# Patient Record
Sex: Female | Born: 1980 | Hispanic: Yes | Smoking: Never smoker
Health system: Southern US, Community
[De-identification: ages and names within clinical notes are randomized; demographics above are authoritative.]

## PROBLEM LIST (undated history)

## (undated) DIAGNOSIS — K8020 Calculus of gallbladder without cholecystitis without obstruction: Secondary | ICD-10-CM

## (undated) HISTORY — PX: NO PAST SURGERIES: SHX2092

---

## 2009-10-16 ENCOUNTER — Ambulatory Visit (HOSPITAL_COMMUNITY): Admission: RE | Admit: 2009-10-16 | Discharge: 2009-10-16 | Payer: Self-pay | Admitting: Obstetrics & Gynecology

## 2010-01-21 ENCOUNTER — Ambulatory Visit: Payer: Self-pay | Admitting: Obstetrics & Gynecology

## 2010-01-26 ENCOUNTER — Inpatient Hospital Stay (HOSPITAL_COMMUNITY): Admission: AD | Admit: 2010-01-26 | Discharge: 2010-01-27 | Payer: Self-pay | Admitting: Family Medicine

## 2010-01-26 ENCOUNTER — Ambulatory Visit: Payer: Self-pay | Admitting: Physician Assistant

## 2011-03-03 LAB — CBC
Hemoglobin: 11.9 g/dL — ABNORMAL LOW (ref 12.0–15.0)
MCHC: 33 g/dL (ref 30.0–36.0)
MCV: 87.3 fL (ref 78.0–100.0)
RBC: 4.15 MIL/uL (ref 3.87–5.11)

## 2014-04-22 ENCOUNTER — Encounter (HOSPITAL_COMMUNITY): Payer: Self-pay | Admitting: Emergency Medicine

## 2014-04-22 ENCOUNTER — Emergency Department (INDEPENDENT_AMBULATORY_CARE_PROVIDER_SITE_OTHER)
Admission: EM | Admit: 2014-04-22 | Discharge: 2014-04-22 | Disposition: A | Discharge status: Home / Self Care | Payer: Self-pay | Source: Home / Self Care | Attending: Family Medicine | Admitting: Family Medicine

## 2014-04-22 ENCOUNTER — Emergency Department (HOSPITAL_COMMUNITY)
Admission: EM | Admit: 2014-04-22 | Discharge: 2014-04-23 | Disposition: A | Discharge status: Home / Self Care | Payer: No Typology Code available for payment source | Attending: Emergency Medicine | Admitting: Emergency Medicine

## 2014-04-22 ENCOUNTER — Emergency Department (HOSPITAL_COMMUNITY): Payer: No Typology Code available for payment source

## 2014-04-22 DIAGNOSIS — K8020 Calculus of gallbladder without cholecystitis without obstruction: Secondary | ICD-10-CM | POA: Insufficient documentation

## 2014-04-22 DIAGNOSIS — K8050 Calculus of bile duct without cholangitis or cholecystitis without obstruction: Secondary | ICD-10-CM

## 2014-04-22 DIAGNOSIS — R110 Nausea: Secondary | ICD-10-CM | POA: Insufficient documentation

## 2014-04-22 DIAGNOSIS — R198 Other specified symptoms and signs involving the digestive system and abdomen: Secondary | ICD-10-CM

## 2014-04-22 DIAGNOSIS — R1011 Right upper quadrant pain: Secondary | ICD-10-CM

## 2014-04-22 LAB — COMPREHENSIVE METABOLIC PANEL
ALK PHOS: 97 U/L (ref 39–117)
ALT: 213 U/L — AB (ref 0–35)
AST: 172 U/L — ABNORMAL HIGH (ref 0–37)
Albumin: 3.9 g/dL (ref 3.5–5.2)
BILIRUBIN TOTAL: 2.9 mg/dL — AB (ref 0.3–1.2)
BUN: 7 mg/dL (ref 6–23)
CHLORIDE: 101 meq/L (ref 96–112)
CO2: 24 mEq/L (ref 19–32)
Calcium: 9.4 mg/dL (ref 8.4–10.5)
Creatinine, Ser: 0.54 mg/dL (ref 0.50–1.10)
GFR calc non Af Amer: >90 mL/min (ref >90)
GLUCOSE: 92 mg/dL (ref 70–99)
POTASSIUM: 3.8 meq/L (ref 3.7–5.3)
SODIUM: 138 meq/L (ref 137–147)
TOTAL PROTEIN: 7.9 g/dL (ref 6.0–8.3)

## 2014-04-22 LAB — CBC WITH DIFFERENTIAL/PLATELET
Basophils Absolute: 0 10*3/uL (ref 0.0–0.1)
Basophils Relative: 0 % (ref 0–1)
EOS ABS: 0 10*3/uL (ref 0.0–0.7)
Eosinophils Relative: 0 % (ref 0–5)
HCT: 38.5 % (ref 36.0–46.0)
HEMOGLOBIN: 13.2 g/dL (ref 12.0–15.0)
LYMPHS ABS: 1.3 10*3/uL (ref 0.7–4.0)
LYMPHS PCT: 16 % (ref 12–46)
MCH: 29.6 pg (ref 26.0–34.0)
MCHC: 34.3 g/dL (ref 30.0–36.0)
MCV: 86.3 fL (ref 78.0–100.0)
MONOS PCT: 7 % (ref 3–12)
Monocytes Absolute: 0.5 10*3/uL (ref 0.1–1.0)
NEUTROS ABS: 6.3 10*3/uL (ref 1.7–7.7)
NEUTROS PCT: 77 % (ref 43–77)
PLATELETS: 227 10*3/uL (ref 150–400)
RBC: 4.46 MIL/uL (ref 3.87–5.11)
RDW: 12.7 % (ref 11.5–15.5)
WBC: 8.2 10*3/uL (ref 4.0–10.5)

## 2014-04-22 LAB — POCT URINALYSIS DIP (DEVICE)
GLUCOSE, UA: NEGATIVE mg/dL
Hgb urine dipstick: NEGATIVE
KETONES UR: NEGATIVE mg/dL
NITRITE: NEGATIVE
PH: 8.5 — AB (ref 5.0–8.0)
Protein, ur: NEGATIVE mg/dL
Specific Gravity, Urine: 1.02 (ref 1.005–1.030)
Urobilinogen, UA: 2 mg/dL — ABNORMAL HIGH (ref 0.0–1.0)

## 2014-04-22 LAB — POCT PREGNANCY, URINE: PREG TEST UR: NEGATIVE

## 2014-04-22 LAB — LIPASE, BLOOD: LIPASE: 20 U/L (ref 11–59)

## 2014-04-22 MED ORDER — ONDANSETRON 4 MG PO TBDP
4.0000 mg | ORAL_TABLET | Freq: Once | ORAL | Status: AC
  Administered 2014-04-22: 4 mg via ORAL
  Filled 2014-04-22: qty 1

## 2014-04-22 NOTE — ED Provider Notes (Signed)
CSN: [id number]     Arrival date & time 04/22/14  1745 History   First MD Initiated Contact with Patient 04/22/14 1850     Chief Complaint  Patient presents with  . Abdominal Pain   (Consider location/radiation/quality/duration/timing/severity/associated sxs/prior Treatment) HPI Comments: 33 year old female presents complaining of 2 days of worsening right upper quadrant abdominal pain and nausea. Her pain has been getting progressively worse. It waxes and wanes in severity but never completely goes away. It has been made worse by eating anything but not by drinking water. She has associated nausea. Without vomiting. She has no diarrhea. She has no respiratory symptoms. No recent travel or sick contacts. She had similar symptoms 6 months ago but they were not near this bad. She rates her pain as 10 out of 10 earlier, but currently 7/10. No history of any abdominal surgeries.   History reviewed. No pertinent past medical history. History reviewed. No pertinent past surgical history. No family history on file. History  Substance Use Topics  . Smoking status: Never Smoker   . Smokeless tobacco: Not on file  . Alcohol Use: No   OB History   Grav Para Term Preterm Abortions TAB SAB Ect Mult Living                 Review of Systems  Constitutional: Negative for fever and chills.  Gastrointestinal: Positive for nausea and abdominal pain. Negative for vomiting, diarrhea and constipation.  All other systems reviewed and are negative.   Allergies  Review of patient's allergies indicates no known allergies.  Home Medications   Prior to Admission medications   Not on File   BP 128/81  Pulse 84  Temp(Src) 98.5 F (36.9 C) (Oral)  Resp 16  SpO2 99%  LMP 04/04/2014 Physical Exam  Nursing note and vitals reviewed. Constitutional: She is oriented to person, place, and time. Vital signs are normal. She appears well-developed and well-nourished. No distress.  HENT:  Head: Normocephalic  and atraumatic.  Pulmonary/Chest: Effort normal. No respiratory distress. She exhibits no tenderness.  Abdominal: Normal appearance and bowel sounds are normal. There is no hepatosplenomegaly. There is tenderness in the right upper quadrant. There is positive Murphy's sign. There is no rigidity, no rebound, no guarding, no CVA tenderness and no tenderness at McBurney's point.  Neurological: She is alert and oriented to person, place, and time. She has normal strength. Coordination normal.  Skin: Skin is warm and dry. No rash noted. She is not diaphoretic.  Psychiatric: She has a normal mood and affect. Judgment normal.    ED Course  Procedures (including critical care time) Labs Review Labs Reviewed  POCT URINALYSIS DIP (DEVICE) - Abnormal; Notable for the following:    Bilirubin Urine SMALL (*)    pH 8.5 (*)    Urobilinogen, UA 2.0 (*)    Leukocytes, UA TRACE (*)    All other components within normal limits  POCT PREGNANCY, URINE    Imaging Review No results found.   MDM   1. RUQ abdominal pain   2. Positive Murphy's Sign    I've discussed with this patient the option of sending labs here and based on that deciding whether to get ultrasound tomorrow or to send her to the emergency department tonight for evaluation, or just transfer to the emergency department for a thorough definitive workup of her symptoms. She elects to just go to the emergency department.    Graylon GoodZachary H Corean Yoshimura, PA-C 04/22/14 1947

## 2014-04-22 NOTE — ED Notes (Signed)
Pt returned from ultrasound

## 2014-04-22 NOTE — ED Provider Notes (Signed)
CSN: [id number]     Arrival date & time 04/22/14  1945 History   First MD Initiated Contact with Patient 04/22/14 2145     Chief Complaint  Patient presents with  . Abdominal Pain     (Consider location/radiation/quality/duration/timing/severity/associated sxs/prior Treatment) Patient is a 33 y.o. female presenting with abdominal pain. The history is provided by the patient.  Abdominal Pain Associated symptoms: nausea   Associated symptoms: no chest pain, no diarrhea, no fever, no shortness of breath and no vomiting    patient has had upper abdominal pain for last 2 days. Is worse after eating. She did seen at urgent care and sent here to rule out biliary disease. She's had somewhat decreased oral intake and appetite. No fevers. She's had some nausea without vomiting or diarrhea. No fevers. She denies possibility of pregnancy  History reviewed. No pertinent past medical history. History reviewed. No pertinent past surgical history. History reviewed. No pertinent family history. History  Substance Use Topics  . Smoking status: Never Smoker   . Smokeless tobacco: Not on file  . Alcohol Use: No   OB History   Grav Para Term Preterm Abortions TAB SAB Ect Mult Living                 Review of Systems  Constitutional: Positive for appetite change. Negative for fever and activity change.  Eyes: Negative for pain.  Respiratory: Negative for chest tightness and shortness of breath.   Cardiovascular: Negative for chest pain and leg swelling.  Gastrointestinal: Positive for nausea and abdominal pain. Negative for vomiting and diarrhea.  Genitourinary: Negative for flank pain.  Musculoskeletal: Negative for back pain and neck stiffness.  Skin: Negative for rash.  Neurological: Negative for weakness, numbness and headaches.  Psychiatric/Behavioral: Negative for behavioral problems.      Allergies  Review of patient's allergies indicates no known allergies.  Home Medications    Prior to Admission medications   Medication Sig Start Date End Date Taking? Authorizing Provider  ibuprofen (ADVIL,MOTRIN) 200 MG tablet Take 400 mg by mouth every 6 (six) hours as needed for mild pain.   Yes Historical Provider, MD   BP 105/72  Pulse 84  Temp(Src) 99.1 F (37.3 C) (Oral)  Resp 20  Ht 5\' 1"  (1.549 m)  Wt 177 lb 7 oz (80.485 kg)  BMI 33.54 kg/m2  SpO2 100%  LMP 04/04/2014 Physical Exam  Nursing note and vitals reviewed. Constitutional: She is oriented to person, place, and time. She appears well-developed and well-nourished.  HENT:  Head: Normocephalic and atraumatic.  Eyes: EOM are normal. Pupils are equal, round, and reactive to light.  Neck: Normal range of motion. Neck supple.  Cardiovascular: Normal rate, regular rhythm and normal heart sounds.   No murmur heard. Pulmonary/Chest: Effort normal and breath sounds normal. No respiratory distress. She has no wheezes. She has no rales.  Abdominal: Soft. Bowel sounds are normal. She exhibits no distension. There is tenderness. There is no rebound and no guarding.  Somewhat obese. Right upper quadrant tenderness without rebound or guarding.  Musculoskeletal: Normal range of motion.  Neurological: She is alert and oriented to person, place, and time. No cranial nerve deficit.  Skin: Skin is warm and dry.  Psychiatric: She has a normal mood and affect. Her speech is normal.    ED Course  Procedures (including critical care time) Labs Review Labs Reviewed  COMPREHENSIVE METABOLIC PANEL - Abnormal; Notable for the following:    AST 172 (*)  ALT 213 (*)    Total Bilirubin 2.9 (*)    All other components within normal limits  CBC WITH DIFFERENTIAL  LIPASE, BLOOD    Imaging Review [place] Abdomen Complete  04/22/2014   CLINICAL DATA:  Right upper quadrant pain with elevated LFTs  EXAM: ULTRASOUND ABDOMEN COMPLETE  COMPARISON:  None.  FINDINGS: Gallbladder:  Multiple mobile gallstones are identified. No  significant wall thickening is noted. A negative sonographic Eulah PontMurphy sign is seen.  Common bile duct:  Diameter: 6 mm. This is at the upper limits of normal for the patient's age.  Liver:  No mass lesion is noted.  No biliary ductal dilatation is seen.  IVC:  No abnormality visualized.  Pancreas:  Not well visualized due to overlying bowel gas.  Spleen:  Size and appearance within normal limits.  Right Kidney:  Length: 10.8 cm. Echogenicity within normal limits. No mass or hydronephrosis visualized.  Left Kidney:  Length: 10.9 cm. A 1 cm focus of increased echogenicity is noted in the upper pole of the left kidney. 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 No definitive shadowing to suggest calculus is noted. A  Abdominal aorta:  No aneurysm visualized.  Other findings:  None.  IMPRESSION: Multiple gallstones without complicating factors. The common bile duct is at the upper limits of normal for the patient's age. Given the LFTs possible obstructive process could not be totally excluded although no intrahepatic biliary ductal dilatation is noted.  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   Electronically Signed   By: Alcide CleverMark  Lukens M.D.   On: 04/22/2014 23:28     EKG Interpretation None      MDM   Final diagnoses:  Biliary colic    The patient's abdominal pain. LFTs elevated. Ultrasound shows gallstones without cholecystitis. Discussed with general surgery, who will see the patient in followup in the clinic.    Juliet RudeNathan R. Rubin PayorPickering, MD 04/23/14 0003

## 2014-04-22 NOTE — ED Notes (Signed)
Dr Pickering at bedside 

## 2014-04-22 NOTE — ED Notes (Signed)
Pt c/o RUQ pain that radiates to back onset 2 days Pain is mild and constant w/intermittent sharp pains Increases w/pressure Sx also include nausea and tingly of hands w/sharp pains Denies f/v/d; normal BM, urinary sx Taking tyle w/no relief Alert w/no signs of acute distress.

## 2014-04-22 NOTE — ED Provider Notes (Signed)
Medical screening examination/treatment/procedure(s) were performed by resident physician or non-physician practitioner and as supervising physician I was immediately available for consultation/collaboration.   Hildreth Orsak DOUGLAS MD.   Kiely Cousar D Gurpreet Mariani, MD 04/22/14 2043 

## 2014-04-22 NOTE — ED Notes (Addendum)
Pt from urgent care to R/O gallstones. Pt had urine done at urgent care with negative pregnancy. Pt states right upper quadrant pain. Pt states Sunday night the pain started. Pt states constant pain with intermittant increases after she drinks water. Pt states that pain stays local to right upper quadrant.

## 2014-04-22 NOTE — Discharge Instructions (Signed)

## 2014-06-02 ENCOUNTER — Ambulatory Visit (INDEPENDENT_AMBULATORY_CARE_PROVIDER_SITE_OTHER): Payer: No Typology Code available for payment source | Admitting: Internal Medicine

## 2014-06-02 ENCOUNTER — Encounter: Payer: Self-pay | Admitting: Internal Medicine

## 2014-06-02 VITALS — BP 120/76 | HR 79 | Temp 98.0°F | Resp 20 | Ht 60.0 in | Wt 178.0 lb

## 2014-06-02 DIAGNOSIS — K8020 Calculus of gallbladder without cholecystitis without obstruction: Secondary | ICD-10-CM

## 2014-06-02 DIAGNOSIS — Z0000 Encounter for general adult medical examination without abnormal findings: Secondary | ICD-10-CM

## 2014-06-02 DIAGNOSIS — R748 Abnormal levels of other serum enzymes: Secondary | ICD-10-CM | POA: Insufficient documentation

## 2014-06-02 NOTE — Progress Notes (Signed)
Patient ID: Christy Boyer, female   DOB: 05/07/1981, 33 y.o.   MRN: [id number]   Christy Boyer, is a 33 y.o. female  [id number]  [id number]  DOB - 05/07/1981  CC:  Chief Complaint  Patient presents with  . Establish Care    NP/Pt states at times mid to lower back pain. Pt is in no pain at this time       HPI: Christy Boyer is a 33 y.o. female here today to establish medical care. She was recently seen in the ED for back pain and diagnosed with gallstones without Cholecystitis. She states that on the day that she was seen in the ED she had N/V and back pain. She has been able to control symptoms by avoiding certain foods (fatty foods). She takes occasional ibuprofen for pain and sticks to non-fatty foods.  She denies any other problems. She had her cholesterol done about 3 years ago at which time she reported it to be normal.   Patient has No headache, No chest pain, No abdominal pain - No Nausea, No new weakness tingling or numbness, No Cough - SOB.   Information communicated through medical interpreter.  No Known Allergies History reviewed. No pertinent past medical history. Current Outpatient Prescriptions on File Prior to Visit  Medication Sig Dispense Refill  . ibuprofen (ADVIL,MOTRIN) 200 MG tablet Take 400 mg by mouth every 6 (six) hours as needed for mild pain.       No current facility-administered medications on file prior to visit.   Family History  Problem Relation Age of Onset  . Heart disease Father    History   Social History  . Marital Status: Married    Spouse Name: N/A    Number of Children: 3  . Years of Education: N/A   Occupational History  . Not on file.   Social History Main Topics  . Smoking status: Never Smoker   . Smokeless tobacco: Not on file  . Alcohol Use: Yes     Comment: Very infrequently.  . Drug Use: No  . Sexual Activity: Yes    Birth Control/ Protection: IUD     Comment:  [employer])- 4 years   Other Topics Concern  . Not on file   Social History Narrative  . No narrative on file    Review of Systems: Constitutional: Negative for fever, chills, diaphoresis, activity change, appetite change and fatigue. HENT: Negative for ear pain, nosebleeds, congestion, facial swelling, rhinorrhea, neck pain, neck stiffness and ear discharge.  Eyes: Negative for pain, discharge, redness, itching and visual disturbance. Respiratory: Negative for cough, choking, chest tightness, shortness of breath, wheezing and stridor.  Cardiovascular: Negative for chest pain, palpitations and leg swelling. Gastrointestinal: Negative for abdominal distention. Genitourinary: Negative for dysuria, urgency, frequency, hematuria, flank pain, decreased urine volume, difficulty urinating and dyspareunia.  Musculoskeletal: Negative for back pain, joint swelling, arthralgia and gait problem. Neurological: Negative for dizziness, tremors, seizures, syncope, facial asymmetry, speech difficulty, weakness, light-headedness, numbness and headaches.  Hematological: Negative for adenopathy. Does not bruise/bleed easily. Psychiatric/Behavioral: Negative for hallucinations, behavioral problems, confusion, dysphoric mood, decreased concentration and agitation.    Objective:     Filed Vitals:   06/02/14 1046  BP: 120/76  Pulse: 79  Temp: 98 F (36.7 C)  Resp: 20    Physical Exam: Constitutional: Patient appears well-developed and well-nourished. No distress. Well appearing. HENT: Normocephalic, atraumatic, External right and left ear normal. Oropharynx is clear and moist.  Eyes: Conjunctivae and  EOM are normal. PERRLA, no scleral icterus. Neck: Normal ROM. Neck supple. No JVD. No tracheal deviation. No thyromegaly. CVS: RRR, S1/S2 +, no murmurs, no gallops, no carotid bruit.  Pulmonary: Effort and breath sounds normal, no stridor, rhonchi, wheezes, rales.  Abdominal:  Soft. BS +, no distension, tenderness, rebound or guarding. Particularly no RUQ tenderness noted and Murphy's sign absent. Musculoskeletal: Normal range of motion. No edema and no tenderness.  Lymphadenopathy: No lymphadenopathy noted, cervical, inguinal or axillary Neuro: Alert. Normal reflexes, muscle tone coordination. No cranial nerve deficit. Skin: Skin is warm and dry. No rash noted. Not diaphoretic. No erythema. No pallor. Psychiatric: Normal mood and affect. Behavior, judgment, thought content normal.  Lab Results  Component Value Date   WBC 8.2 04/22/2014   HGB 13.2 04/22/2014   HCT 38.5 04/22/2014   MCV 86.3 04/22/2014   PLT 227 04/22/2014   Lab Results  Component Value Date   CREATININE 0.54 04/22/2014   BUN 7 04/22/2014   NA 138 04/22/2014   K 3.8 04/22/2014   CL 101 04/22/2014   CO2 24 04/22/2014    No results found for this basename: HGBA1C   Lipid Panel  No results found for this basename: chol, trig, hdl, cholhdl, vldl, ldlcalc       Assessment and plan:  1. Cholelithiasis: Pt has gallstones without signs of acute inflammation.She did have an episode of biliary colic and was evaluated and treated in the ED. I will refer to Surgery for possible cholecystectomy.   2. Elevated Liver Enzymes: Will recheck CMET. Likely associated with biliary Colic.  2. Preventative Care: [facility] Will obtain records, Check fasting lipids.   Return in about 2 weeks (around 06/16/2014).  Labs: Lipid panel, CMET, U/A, TSH. Vitamin D.  The patient was given clear instructions to go to ER or return to medical center if symptoms don't improve, worsen or new problems develop. The patient verbalized understanding. The patient was told to call to get lab results if they haven't heard anything in the next week.     This note has been created with Education officer, environmentalDragon speech recognition software and smart phrase technology. Any transcriptional errors are unintentional.     MATTHEWS,MICHELLE A., MD [facility] [place], [place] [id number]   06/02/2014, 11:21 AM

## 2014-06-03 ENCOUNTER — Other Ambulatory Visit: Payer: No Typology Code available for payment source

## 2014-06-03 DIAGNOSIS — K8020 Calculus of gallbladder without cholecystitis without obstruction: Secondary | ICD-10-CM

## 2014-06-03 DIAGNOSIS — Z0000 Encounter for general adult medical examination without abnormal findings: Secondary | ICD-10-CM

## 2014-06-03 DIAGNOSIS — R748 Abnormal levels of other serum enzymes: Secondary | ICD-10-CM

## 2014-06-04 LAB — COMPREHENSIVE METABOLIC PANEL
ALBUMIN: 4.3 g/dL (ref 3.5–5.2)
ALK PHOS: 66 U/L (ref 39–117)
ALT: 18 U/L (ref 0–35)
AST: 15 U/L (ref 0–37)
BILIRUBIN TOTAL: 0.7 mg/dL (ref 0.2–1.2)
BUN: 12 mg/dL (ref 6–23)
CO2: 24 meq/L (ref 19–32)
Calcium: 9.2 mg/dL (ref 8.4–10.5)
Chloride: 105 mEq/L (ref 96–112)
Creat: 0.56 mg/dL (ref 0.50–1.10)
Glucose, Bld: 96 mg/dL (ref 70–99)
POTASSIUM: 4 meq/L (ref 3.5–5.3)
SODIUM: 138 meq/L (ref 135–145)
TOTAL PROTEIN: 7.3 g/dL (ref 6.0–8.3)

## 2014-06-04 LAB — LIPID PANEL
CHOL/HDL RATIO: 2.7 ratio
Cholesterol: 118 mg/dL (ref 0–200)
HDL: 44 mg/dL (ref >39)
LDL CALC: 63 mg/dL (ref 0–99)
Triglycerides: 56 mg/dL (ref <150)
VLDL: 11 mg/dL (ref 0–40)

## 2014-06-04 LAB — VITAMIN D 25 HYDROXY (VIT D DEFICIENCY, FRACTURES): Vit D, 25-Hydroxy: 26 ng/mL — ABNORMAL LOW (ref 30–89)

## 2014-06-16 ENCOUNTER — Encounter: Payer: Self-pay | Admitting: Internal Medicine

## 2014-06-16 ENCOUNTER — Ambulatory Visit (INDEPENDENT_AMBULATORY_CARE_PROVIDER_SITE_OTHER): Payer: No Typology Code available for payment source | Admitting: Internal Medicine

## 2014-06-16 VITALS — BP 107/63 | HR 81 | Temp 98.6°F | Resp 20 | Ht 61.0 in | Wt 178.0 lb

## 2014-06-16 DIAGNOSIS — K8020 Calculus of gallbladder without cholecystitis without obstruction: Secondary | ICD-10-CM

## 2014-06-16 DIAGNOSIS — K8050 Calculus of bile duct without cholangitis or cholecystitis without obstruction: Secondary | ICD-10-CM

## 2014-06-16 DIAGNOSIS — E559 Vitamin D deficiency, unspecified: Secondary | ICD-10-CM

## 2014-06-16 DIAGNOSIS — G8929 Other chronic pain: Secondary | ICD-10-CM

## 2014-06-16 DIAGNOSIS — R1011 Right upper quadrant pain: Secondary | ICD-10-CM

## 2014-06-16 DIAGNOSIS — R748 Abnormal levels of other serum enzymes: Secondary | ICD-10-CM

## 2014-06-16 MED ORDER — VITAMIN D 50 MCG (2000 UT) PO CAPS: 4000.0000 [IU] | ORAL_CAPSULE | Freq: Every day | ORAL | Status: DC

## 2014-06-16 NOTE — Progress Notes (Signed)
   Subjective:    Patient ID: Christy Boyer, female    DOB: 12/16/1980, 33 y.o.   MRN: [id number]  HPI: Pt here for follow up of gallstones and reports that she continues to have biliary colic with certain foods. She denies pain or tenderness otherwise.  She has been initially resistant to referral to Surgery, however after a long discussion today she is accepting of the idea of an elective cholecystectomy.   She otherwise has no other complaints.   Review of Systems  Constitutional: Negative.  Negative for fever, chills, activity change, appetite change and fatigue.  HENT: Negative.  Negative for dental problem, ear pain, hearing loss, sore throat and trouble swallowing.   Eyes: Negative.  Negative for visual disturbance.  Respiratory: Negative.  Negative for cough, chest tightness, shortness of breath and wheezing.   Cardiovascular: Negative.  Negative for chest pain, palpitations and leg swelling.  Gastrointestinal: Positive for nausea (Biliary colic with certain foods.) and abdominal pain (Biliary colic with certain foods.). Negative for diarrhea, constipation, blood in stool, anal bleeding and rectal pain.       Biliary colic with certain foods.  Endocrine: Negative.  Negative for cold intolerance, heat intolerance, polydipsia, polyphagia and polyuria.  Genitourinary: Negative for dysuria, urgency, frequency, genital sores, vaginal pain, menstrual problem and dyspareunia.  Musculoskeletal: Negative.   Skin: Negative.   Allergic/Immunologic: Negative.  Negative for environmental allergies.  Neurological: Negative.  Negative for dizziness, tremors, weakness and headaches.  Hematological: Negative.   Psychiatric/Behavioral: Negative.   All other systems reviewed and are negative.      Objective:   Physical Exam  Constitutional: She is oriented to person, place, and time. She appears well-developed and well-nourished.  HENT:  Head: Normocephalic and atraumatic.  Eyes:  Conjunctivae and EOM are normal. Pupils are equal, round, and reactive to light. No scleral icterus.  Neck: Normal range of motion. Neck supple. No JVD present. No thyromegaly present.  Cardiovascular: Normal rate and regular rhythm.  Exam reveals no gallop and no friction rub.   No murmur heard. Pulmonary/Chest: Effort normal and breath sounds normal. She has no wheezes. She has no rales.  Abdominal: Soft. Bowel sounds are normal. She exhibits no distension and no mass. There is no tenderness. There is no rebound.  Negative murphy's sign.  Musculoskeletal: Normal range of motion.  Neurological: She is alert and oriented to person, place, and time. No cranial nerve deficit.  Skin: Skin is warm and dry.  Psychiatric: She has a normal mood and affect. Her behavior is normal. Judgment and thought content normal.          Assessment & Plan:  1. Gallstones with intermittent biliary colic: Advised continued avoidance of trigger  foods. I will refer to  general surgery for possible laproscopic cholecystectomy.  2. Vitamin D Deficiency: Start Vitamin D supplementation.  Labs: None  RTC: 1 year or PRN

## 2014-06-17 ENCOUNTER — Telehealth: Payer: Self-pay

## 2014-06-17 DIAGNOSIS — E559 Vitamin D deficiency, unspecified: Secondary | ICD-10-CM | POA: Insufficient documentation

## 2014-06-17 DIAGNOSIS — K8050 Calculus of bile duct without cholangitis or cholecystitis without obstruction: Secondary | ICD-10-CM | POA: Insufficient documentation

## 2014-06-17 DIAGNOSIS — G8929 Other chronic pain: Secondary | ICD-10-CM | POA: Insufficient documentation

## 2014-06-17 DIAGNOSIS — R1011 Right upper quadrant pain: Secondary | ICD-10-CM

## 2014-06-17 NOTE — Telephone Encounter (Signed)
Pt's Surg Referal was sent over Via Epic in care of [employer].Pt will be contacted up on recieving request.

## 2014-06-28 ENCOUNTER — Emergency Department (HOSPITAL_COMMUNITY)
Admission: EM | Admit: 2014-06-28 | Discharge: 2014-06-28 | Disposition: A | Discharge status: Home / Self Care | Payer: No Typology Code available for payment source | Attending: Emergency Medicine | Admitting: Emergency Medicine

## 2014-06-28 ENCOUNTER — Encounter (HOSPITAL_COMMUNITY): Payer: Self-pay | Admitting: Emergency Medicine

## 2014-06-28 DIAGNOSIS — Z3202 Encounter for pregnancy test, result negative: Secondary | ICD-10-CM | POA: Insufficient documentation

## 2014-06-28 DIAGNOSIS — K8020 Calculus of gallbladder without cholecystitis without obstruction: Secondary | ICD-10-CM | POA: Insufficient documentation

## 2014-06-28 DIAGNOSIS — K8050 Calculus of bile duct without cholangitis or cholecystitis without obstruction: Secondary | ICD-10-CM

## 2014-06-28 HISTORY — DX: Calculus of gallbladder without cholecystitis without obstruction: K80.20

## 2014-06-28 LAB — COMPREHENSIVE METABOLIC PANEL
ALT: 71 U/L — ABNORMAL HIGH (ref 0–35)
AST: 100 U/L — ABNORMAL HIGH (ref 0–37)
Albumin: 4 g/dL (ref 3.5–5.2)
Alkaline Phosphatase: 88 U/L (ref 39–117)
Anion gap: 15 (ref 5–15)
BUN: 16 mg/dL (ref 6–23)
CO2: 24 mEq/L (ref 19–32)
Calcium: 9.3 mg/dL (ref 8.4–10.5)
Chloride: 101 mEq/L (ref 96–112)
Creatinine, Ser: 0.57 mg/dL (ref 0.50–1.10)
GFR calc Af Amer: >90 mL/min (ref >90)
GFR calc non Af Amer: >90 mL/min (ref >90)
Glucose, Bld: 104 mg/dL — ABNORMAL HIGH (ref 70–99)
Potassium: 4.2 mEq/L (ref 3.7–5.3)
Sodium: 140 mEq/L (ref 137–147)
Total Bilirubin: 0.8 mg/dL (ref 0.3–1.2)
Total Protein: 7.9 g/dL (ref 6.0–8.3)

## 2014-06-28 LAB — CBC WITH DIFFERENTIAL/PLATELET
Basophils Absolute: 0 10*3/uL (ref 0.0–0.1)
Basophils Relative: 0 % (ref 0–1)
Eosinophils Absolute: 0.1 10*3/uL (ref 0.0–0.7)
Eosinophils Relative: 0 % (ref 0–5)
HCT: 39.7 % (ref 36.0–46.0)
Hemoglobin: 13.1 g/dL (ref 12.0–15.0)
Lymphocytes Relative: 13 % (ref 12–46)
Lymphs Abs: 1.6 10*3/uL (ref 0.7–4.0)
MCH: 28.9 pg (ref 26.0–34.0)
MCHC: 33 g/dL (ref 30.0–36.0)
MCV: 87.4 fL (ref 78.0–100.0)
Monocytes Absolute: 0.8 10*3/uL (ref 0.1–1.0)
Monocytes Relative: 6 % (ref 3–12)
Neutro Abs: 10.1 10*3/uL — ABNORMAL HIGH (ref 1.7–7.7)
Neutrophils Relative %: 81 % — ABNORMAL HIGH (ref 43–77)
Platelets: 232 10*3/uL (ref 150–400)
RBC: 4.54 MIL/uL (ref 3.87–5.11)
RDW: 13 % (ref 11.5–15.5)
WBC: 12.6 10*3/uL — ABNORMAL HIGH (ref 4.0–10.5)

## 2014-06-28 LAB — URINALYSIS, ROUTINE W REFLEX MICROSCOPIC
Bilirubin Urine: NEGATIVE
Glucose, UA: NEGATIVE mg/dL
Hgb urine dipstick: NEGATIVE
Ketones, ur: NEGATIVE mg/dL
Leukocytes, UA: NEGATIVE
Nitrite: NEGATIVE
Protein, ur: NEGATIVE mg/dL
Specific Gravity, Urine: 1.022 (ref 1.005–1.030)
Urobilinogen, UA: 1 mg/dL (ref 0.0–1.0)
pH: 7 (ref 5.0–8.0)

## 2014-06-28 LAB — POC URINE PREG, ED: Preg Test, Ur: NEGATIVE

## 2014-06-28 LAB — LIPASE, BLOOD: Lipase: 28 U/L (ref 11–59)

## 2014-06-28 MED ORDER — ONDANSETRON 4 MG PO TBDP
4.0000 mg | ORAL_TABLET | Freq: Once | ORAL | Status: AC
  Administered 2014-06-28: 4 mg via ORAL
  Filled 2014-06-28: qty 1

## 2014-06-28 MED ORDER — ONDANSETRON HCL 4 MG PO TABS: 4.0000 mg | ORAL_TABLET | Freq: Four times a day (QID) | ORAL | Status: DC

## 2014-06-28 MED ORDER — OXYCODONE-ACETAMINOPHEN 5-325 MG PO TABS: 1.0000 | ORAL_TABLET | ORAL | Status: DC | PRN

## 2014-06-28 MED ORDER — OXYCODONE-ACETAMINOPHEN 5-325 MG PO TABS
1.0000 | ORAL_TABLET | Freq: Once | ORAL | Status: AC
  Administered 2014-06-28: 1 via ORAL
  Filled 2014-06-28: qty 1

## 2014-06-28 MED ORDER — ONDANSETRON 4 MG PO TBDP
8.0000 mg | ORAL_TABLET | Freq: Once | ORAL | Status: AC
  Administered 2014-06-28: 8 mg via ORAL
  Filled 2014-06-28: qty 2

## 2014-06-28 MED ORDER — HYDROMORPHONE HCL PF 1 MG/ML IJ SOLN
1.0000 mg | Freq: Once | INTRAMUSCULAR | Status: AC
  Administered 2014-06-28: 1 mg via INTRAMUSCULAR
  Filled 2014-06-28: qty 1

## 2014-06-28 NOTE — Discharge Instructions (Signed)
Clico biliar (Biliary Colic)  El clico biliar es un dolor continuo o irregular en la zona superior del abdomen. Generalmente se ubica debajo de la zona derecha de la caja torcica. Aparece cuando los clculos biliares interfieren con el flujo normal de la bilis que proviene de la vescula. La bilis es un lquido que interviene en la digestin de las [place]. Se produce en el hgado y se almacena en la vescula. Al comer, La bilis pasa desde la vescula, a travs del conducto cstico y el conducto biliar comn al intestino delgado. All se mezcla con la comida parcialmente digerida. Si un clculo obstruye alguno de esos conductos, se detiene el flujo normal de bilis. Las clulas del conducto biliar se contraen con fuerza para mover el clculo. Esto causa el dolor del clico biliar.  SNTOMAS  El paciente se queja de dolor en la zona superior del abdomen. El dolor puede ser:  En el centro de la zona superior del abdomen, justo por debajo del esternn.  En la zona superior derecha del abdomen, donde se encuentra la vescula biliar y el hgado.  Se expande hacia la espalda, hacia el omplato derecho.  Nuseas y vmitos  El dolor comienza generalmente despus de comer.  El clico biliar aparece como una demanda de bilis por parte del sistema digestivo. La demanda de bilis es mayor luego de ingerir alimentos ricos en grasas. Los sntomas tambin Geophysicist/field seismologist que han estado ayunando e ingieren abruptamente una comida abundante. La mayora de los episodios de clico biliar mejoran luego de 1 a 5 horas. Despus que se alivia el dolor ms intenso, podr seguir sintiendo un dolor moderado en el abdomen durante un lapso de 24 horas. DIAGNSTICO Luego de escuchar la descripcin de los sntomas, el mdico realizar un examen fsico. Deber prestar atencin a la zona superior del abdomen. Esta es la zona en la que se encuentra el hgado y la vescula biliar. El mdico podr observar los clculos  a travs de una ecografa. Tambin le realizaran un escaneo especializado de la vescula biliar. Le indicarn anlisis de [place], especialmente si tiene fiebre o el dolor persiste. PREVENCIN El clico biliar puede evitarse controlando los factores de riesgo que favorecen los clculos. Algunos de [employer] factores de riesgo como la herencia, el aumento de la edad y Water quality scientist son aspectos normales de la vida. La obesidad y [place] dieta rica en grasas son factores de riesgo que usted puede modificar a travs de cambios hacia un estilo de vida saludable. Las mujeres que atraviesan la menopausia y que reciben terapia de reemplazo hormonal (estrgenos) tambin tienen ms riesgo de Actor clicos biliares. TRATAMIENTO  Le prescribirn analgsicos.  Le indicarn una dieta sin grasas.  Si el primer episodio es intenso, o si los clicos aparecen nuevamente, generalmente se indica la ciruga para extirpar la vescula (colecistectoma). Este procedimiento puede realizarse a travs de pequeas incisiones utilizando un instrumento denominado laparoscopio. Muchas veces se requiere una breve estada en el hospital. Algunas personas reciben el alta el mismo da. Es el tratamiento ms ampliamente utilizado en personas que sufren dolor por clculos biliares. Es efectivo y seguro, no tiene complicaciones en ms del 90% de los [place].  Si la ciruga no puede llevarse a cabo, podrn utilizarse medicamentos para [employer] clculos. Esta medicacin es cara y puede demorar meses o aos hasta que [place]. Slo podr disolver clculos pequeos.  En algunos casos raros, se combinan estos medicamentos con un procedimiento denominado litotricia por [employer]  de choque. Este procedimiento utiliza ondas de choque cuidadosamente dirigidas a romper los cálculos. En muchas personas tratadas con este procedimiento, los cálculos vuelven a formarse luego de algunos años. °PRONÓSTICO °Si los cálculos obstruyen el conducto cístico o  conducto biliar común, usted tiene el riesgo de sufrir episodios repetidos de cólicos biliares. También existe un 25% de probabilidades de desarrollar una infección de la vesícula biliar (colecistitisaguda) o alguna otra complicación en los siguientes 10 a 20 años. Si ha sido sometido a una cirugía, prográmela para el momento en que sea conveniente para usted, y para cuando no se encuentre enfermo. °INSTRUCCIONES PARA EL CUIDADO DOMICILIARIO °· Beba gran cantidad de líquidos claros. °· Evite las comidas con mucha grasa o fritas, o cualquier alimento que empeore su dolor. °· Tome los medicamentos como se le indicó. °SOLICITE ATENCIÓN MÉDICA SI: °· Le sube la temperatura a más de 100.5° F (38.1° C). °· El dolor empeora con el tiempo. °· Siente náuseas y esto le impide comer y beber. °· Tiene vómitos. °SOLICITE ATENCIÓN MÉDICA DE INMEDIATO SI: °· Siente dolor continuo e intenso en el abdomen, que no se alivia con medicamentos. °· Siente náuseas y vómitos que no mejoran con medicamentos. °· Tiene síntomas de cólico biliar y comienza a tener fiebre y escalofríos. Esto puede ser un indicio de que ha desarrollado colecistitis. Comuníquese con su médico inmediatamente. °· Su piel o la parte blanca del ojo se vuelven amarillas (ictericia). °Document Released: 03/06/2008 Document Revised: 02/20/2012 °ExitCare® Patient Information ©2015 [employer]. This information is not intended to replace advice given to you by your health care provider. Make sure you discuss any questions you have with your health care provider. ° °

## 2014-06-28 NOTE — ED Notes (Addendum)
C/o RUQ pain, relates to gall bladder, h/o same, h/o gallstones, "referred to surgeon, but has not seen surgeon", also reports a little nv (x2). No meds PTA.

## 2014-06-30 ENCOUNTER — Ambulatory Visit (INDEPENDENT_AMBULATORY_CARE_PROVIDER_SITE_OTHER): Payer: No Typology Code available for payment source | Admitting: Internal Medicine

## 2014-06-30 ENCOUNTER — Encounter: Payer: Self-pay | Admitting: Internal Medicine

## 2014-06-30 VITALS — BP 121/78 | HR 93 | Temp 98.3°F | Resp 16 | Ht 60.0 in | Wt 176.0 lb

## 2014-06-30 DIAGNOSIS — K8020 Calculus of gallbladder without cholecystitis without obstruction: Secondary | ICD-10-CM

## 2014-06-30 DIAGNOSIS — R748 Abnormal levels of other serum enzymes: Secondary | ICD-10-CM

## 2014-06-30 DIAGNOSIS — K8050 Calculus of bile duct without cholangitis or cholecystitis without obstruction: Secondary | ICD-10-CM

## 2014-06-30 NOTE — Progress Notes (Deleted)
Patient ID: Christy Boyer, female   DOB: 1980/12/17, 33 y.o.   MRN: [id number]

## 2014-06-30 NOTE — Progress Notes (Signed)
   Subjective:    Patient ID: Christy Boyer, female    DOB: 03-28-1981, 33 y.o.   MRN: [id number]  HPI: Pt with biliary colic here after being seen in the ER for another episode of Biliary Colic. She is essentially only able to tolerate water at this time. She has had no fever or chills. She has nausea and pain with meals and has had episodes of post prandial emesis which are non-bilious and non hematematous.    Review of Systems  Constitutional: Negative.  Negative for fever, chills, activity change, appetite change and fatigue.  HENT: Negative.  Negative for dental problem, ear pain, hearing loss, sore throat and trouble swallowing.   Eyes: Negative.  Negative for visual disturbance.  Respiratory: Negative.  Negative for cough, chest tightness, shortness of breath and wheezing.   Cardiovascular: Negative.  Negative for chest pain, palpitations and leg swelling.  Gastrointestinal: Negative.  Negative for abdominal pain, diarrhea, constipation, blood in stool, anal bleeding and rectal pain.  Endocrine: Negative.  Negative for cold intolerance, heat intolerance, polydipsia, polyphagia and polyuria.  Genitourinary: Negative.  Negative for dysuria, urgency, frequency, vaginal discharge, genital sores, vaginal pain, menstrual problem and dyspareunia.  Musculoskeletal: Negative.   Skin: Negative.   Allergic/Immunologic: Negative.  Negative for environmental allergies.  Neurological: Negative.  Negative for dizziness, tremors, weakness and headaches.  Hematological: Negative.   Psychiatric/Behavioral: Negative.   All other systems reviewed and are negative.      Objective:   Physical Exam  Constitutional: She is oriented to person, place, and time. She appears well-developed and well-nourished.  HENT:  Head: Normocephalic and atraumatic.  Eyes: Conjunctivae and EOM are normal. Pupils are equal, round, and reactive to light. No scleral icterus.  Neck: Normal range of motion. Neck  supple. No JVD present. No thyromegaly present.  Cardiovascular: Normal rate and regular rhythm.  Exam reveals no gallop and no friction rub.   No murmur heard. Pulmonary/Chest: Effort normal and breath sounds normal. She has no wheezes. She has no rales.  Abdominal: Soft. Bowel sounds are normal. She exhibits no distension and no mass. There is no tenderness.  Musculoskeletal: Normal range of motion.  Neurological: She is alert and oriented to person, place, and time. No cranial nerve deficit.  Skin: Skin is warm and dry.  Psychiatric: She has a normal mood and affect. Her behavior is normal. Judgment and thought content normal.          Assessment & Plan:  1. Pt with biliary colic who has been seen in the ED on now 3rd occasion and is here for follow-up. [facility] in town. I have discussed her condition with the Surgeons who have advised that it is advantageous and safer to have an elective surgery instead of an emergency case. Given that the patient continues to have biliary colic on a now consistent basis, I feel that she is a candidate for cholecystectomy. I will continue to pursue options for her to receive appropriate care.  -Pt has been advised to continue to avoid trigger foods (which at this point is basically water). If she continue's to have colic I have advised her to report to the ED.  -Labs from ED reviewed.

## 2014-07-03 NOTE — ED Provider Notes (Signed)
CSN: [id number]     Arrival date & time 06/28/14  2018 History   First MD Initiated Contact with Patient 06/28/14 2230     Chief Complaint  Patient presents with  . Abdominal Pain     (Consider location/radiation/quality/duration/timing/severity/associated sxs/prior Treatment) HPI  33 year old female with abdominal pain. Patient has a history of known gallstones. She reports that her current symptoms feel similar to pain she has had in the past with this. There is a language period, the patient seems to be following my questioning and some additional translation provided by others at bedside. She said pain-free intervals, but the pain returned within the last day. Pain is often exacerbated by eating. Associated with nausea. Vomiting yesterday, but none today. No urinary complaints. No fevers or chills. Has been referred to surgery and is in the process of getting an evaluation. She currently has no pain medication to take at home. No other acute complaints.  Past Medical History  Diagnosis Date  . Gall stones    History reviewed. No pertinent past surgical history. Family History  Problem Relation Age of Onset  . Heart disease Father    History  Substance Use Topics  . Smoking status: Never Smoker   . Smokeless tobacco: Not on file  . Alcohol Use: Yes     Comment: Very infrequently.   OB History   Grav Para Term Preterm Abortions TAB SAB Ect Mult Living                 Review of Systems  All systems reviewed and negative, other than as noted in HPI.   Allergies  Review of patient's allergies indicates no known allergies.  Home Medications   Prior to Admission medications   Medication Sig Start Date End Date Taking? Authorizing Provider  ondansetron (ZOFRAN) 4 MG tablet Take 1 tablet (4 mg total) by mouth every 6 (six) hours. 06/28/14   Raeford Razor, MD  oxyCODONE-acetaminophen (PERCOCET/ROXICET) 5-325 MG per tablet Take 1-2 tablets by mouth every 4 (four) hours as  needed for severe pain. 06/28/14   Raeford Razor, MD   BP 106/61  Pulse 90  Temp(Src) 98 F (36.7 C) (Oral)  Resp 19  Ht 5' (1.524 m)  Wt 178 lb (80.74 kg)  BMI 34.76 kg/m2  SpO2 99%  LMP 05/28/2014 Physical Exam  Nursing note and vitals reviewed. Constitutional: She appears well-developed and well-nourished. No distress.  HENT:  Head: Normocephalic and atraumatic.  Eyes: Conjunctivae are normal. Right eye exhibits no discharge. Left eye exhibits no discharge.  Neck: Neck supple.  Cardiovascular: Normal rate, regular rhythm and normal heart sounds.  Exam reveals no gallop and no friction rub.   No murmur heard. Pulmonary/Chest: Effort normal and breath sounds normal. No respiratory distress.  Abdominal: Soft. She exhibits no distension. There is tenderness.  Mild tenderness to the epigastrium and right upper quadrant. No guarding. No rebound. No distention.  Genitourinary:  No CVA tenderness  Musculoskeletal: She exhibits no edema and no tenderness.  Neurological: She is alert.  Skin: Skin is warm and dry.  Psychiatric: She has a normal mood and affect. Her behavior is normal. Thought content normal.    ED Course  Procedures (including critical care time) Labs Review Labs Reviewed  COMPREHENSIVE METABOLIC PANEL - Abnormal; Notable for the following:    Glucose, Bld 104 (*)    AST 100 (*)    ALT 71 (*)    All other components within normal limits  CBC WITH DIFFERENTIAL -  Abnormal; Notable for the following:    WBC 12.6 (*)    Neutrophils Relative % 81 (*)    Neutro Abs 10.1 (*)    All other components within normal limits  LIPASE, BLOOD  URINALYSIS, ROUTINE W REFLEX MICROSCOPIC  POC URINE PREG, ED    Imaging Review No results found.   EKG Interpretation None      MDM   Final diagnoses:  Biliary colic    33 year old female with symptoms consistent with biliary colic. Epigastric/right upper quadrant. Intermittent nature. Often postprandial. Known  cholelithiasis. Need surgical evaluation. She does have some mild tenderness on exam, but no peritonitis. She does have an elevation in her ALT/AST but this is pretty mild. Lipase is normal. She is not currently have any pain medication to take at home. We'll discharge her with a prescription for Percocet and Zofran as needed. We'll give her a decent amount as it may take a while to obtain surgical evaluation with current lack of insurance. She reports that she is working on this though. Return precautions were discussed.    Raeford RazorStephen Tovia Kisner, MD 07/03/14 [phone]

## 2014-07-08 ENCOUNTER — Observation Stay (HOSPITAL_COMMUNITY)
Admission: EM | Admit: 2014-07-08 | Discharge: 2014-07-10 | Disposition: A | Discharge status: Home / Self Care | Payer: Medicaid Other | Attending: General Surgery | Admitting: General Surgery

## 2014-07-08 ENCOUNTER — Encounter (HOSPITAL_COMMUNITY): Payer: Self-pay | Admitting: Emergency Medicine

## 2014-07-08 ENCOUNTER — Emergency Department (HOSPITAL_COMMUNITY): Payer: Medicaid Other

## 2014-07-08 ENCOUNTER — Telehealth: Payer: Self-pay | Admitting: Internal Medicine

## 2014-07-08 DIAGNOSIS — K8010 Calculus of gallbladder with chronic cholecystitis without obstruction: Secondary | ICD-10-CM | POA: Diagnosis not present

## 2014-07-08 DIAGNOSIS — K819 Cholecystitis, unspecified: Secondary | ICD-10-CM

## 2014-07-08 DIAGNOSIS — K8000 Calculus of gallbladder with acute cholecystitis without obstruction: Secondary | ICD-10-CM | POA: Diagnosis present

## 2014-07-08 LAB — URINALYSIS, ROUTINE W REFLEX MICROSCOPIC
BILIRUBIN URINE: NEGATIVE
Glucose, UA: NEGATIVE mg/dL
HGB URINE DIPSTICK: NEGATIVE
KETONES UR: NEGATIVE mg/dL
Nitrite: NEGATIVE
PH: 7 (ref 5.0–8.0)
Protein, ur: NEGATIVE mg/dL
SPECIFIC GRAVITY, URINE: 1.015 (ref 1.005–1.030)
Urobilinogen, UA: 1 mg/dL (ref 0.0–1.0)

## 2014-07-08 LAB — COMPREHENSIVE METABOLIC PANEL
ALK PHOS: 83 U/L (ref 39–117)
ALT: 25 U/L (ref 0–35)
AST: 18 U/L (ref 0–37)
Albumin: 4.2 g/dL (ref 3.5–5.2)
Anion gap: 13 (ref 5–15)
BILIRUBIN TOTAL: 0.5 mg/dL (ref 0.3–1.2)
BUN: 15 mg/dL (ref 6–23)
CHLORIDE: 98 meq/L (ref 96–112)
CO2: 24 mEq/L (ref 19–32)
Calcium: 10.1 mg/dL (ref 8.4–10.5)
Creatinine, Ser: 0.6 mg/dL (ref 0.50–1.10)
GFR calc Af Amer: >90 mL/min (ref >90)
GFR calc non Af Amer: >90 mL/min (ref >90)
Glucose, Bld: 98 mg/dL (ref 70–99)
POTASSIUM: 4.1 meq/L (ref 3.7–5.3)
Sodium: 135 mEq/L — ABNORMAL LOW (ref 137–147)
Total Protein: 8.8 g/dL — ABNORMAL HIGH (ref 6.0–8.3)

## 2014-07-08 LAB — CBC WITH DIFFERENTIAL/PLATELET
BASOS ABS: 0 10*3/uL (ref 0.0–0.1)
Basophils Relative: 0 % (ref 0–1)
Eosinophils Absolute: 0 10*3/uL (ref 0.0–0.7)
Eosinophils Relative: 0 % (ref 0–5)
HCT: 41.9 % (ref 36.0–46.0)
HEMOGLOBIN: 14.3 g/dL (ref 12.0–15.0)
LYMPHS PCT: 25 % (ref 12–46)
Lymphs Abs: 2.3 10*3/uL (ref 0.7–4.0)
MCH: 29.5 pg (ref 26.0–34.0)
MCHC: 34.1 g/dL (ref 30.0–36.0)
MCV: 86.4 fL (ref 78.0–100.0)
Monocytes Absolute: 0.6 10*3/uL (ref 0.1–1.0)
Monocytes Relative: 7 % (ref 3–12)
NEUTROS ABS: 6.4 10*3/uL (ref 1.7–7.7)
Neutrophils Relative %: 68 % (ref 43–77)
Platelets: 271 10*3/uL (ref 150–400)
RBC: 4.85 MIL/uL (ref 3.87–5.11)
RDW: 12.5 % (ref 11.5–15.5)
WBC: 9.3 10*3/uL (ref 4.0–10.5)

## 2014-07-08 LAB — URINE MICROSCOPIC-ADD ON

## 2014-07-08 LAB — SURGICAL PCR SCREEN
MRSA, PCR: NEGATIVE
Staphylococcus aureus: NEGATIVE

## 2014-07-08 LAB — PREGNANCY, URINE: Preg Test, Ur: NEGATIVE

## 2014-07-08 LAB — LIPASE, BLOOD: Lipase: 28 U/L (ref 11–59)

## 2014-07-08 MED ORDER — ONDANSETRON HCL 4 MG/2ML IJ SOLN
4.0000 mg | Freq: Four times a day (QID) | INTRAMUSCULAR | Status: DC | PRN
  Administered 2014-07-08: 4 mg via INTRAVENOUS
  Filled 2014-07-08: qty 2

## 2014-07-08 MED ORDER — PIPERACILLIN-TAZOBACTAM 3.375 G IVPB
3.3750 g | Freq: Three times a day (TID) | INTRAVENOUS | Status: DC
  Administered 2014-07-08 – 2014-07-09 (×2): 3.375 g via INTRAVENOUS
  Filled 2014-07-08 (×3): qty 50

## 2014-07-08 MED ORDER — SODIUM CHLORIDE 0.9 % IV BOLUS (SEPSIS)
1000.0000 mL | Freq: Once | INTRAVENOUS | Status: AC
Start: 2014-07-08 — End: 2014-07-08
  Administered 2014-07-08: 1000 mL via INTRAVENOUS

## 2014-07-08 MED ORDER — HYDROMORPHONE HCL PF 1 MG/ML IJ SOLN
1.0000 mg | INTRAMUSCULAR | Status: DC | PRN
  Administered 2014-07-08: 1 mg via INTRAVENOUS
  Filled 2014-07-08: qty 1

## 2014-07-08 MED ORDER — ENOXAPARIN SODIUM 40 MG/0.4ML ~~LOC~~ SOLN
40.0000 mg | SUBCUTANEOUS | Status: DC
  Administered 2014-07-08: 40 mg via SUBCUTANEOUS
  Filled 2014-07-08 (×2): qty 0.4

## 2014-07-08 MED ORDER — POTASSIUM CHLORIDE IN NACL 20-0.9 MEQ/L-% IV SOLN
INTRAVENOUS | Status: DC
  Administered 2014-07-08: 20:00:00 via INTRAVENOUS
  Administered 2014-07-10: 125 mL via INTRAVENOUS
  Filled 2014-07-08 (×6): qty 1000

## 2014-07-08 MED ORDER — ONDANSETRON HCL 4 MG/2ML IJ SOLN
4.0000 mg | Freq: Once | INTRAMUSCULAR | Status: AC
  Administered 2014-07-08: 4 mg via INTRAVENOUS
  Filled 2014-07-08: qty 2

## 2014-07-08 NOTE — H&P (Signed)
Christy Boyer is an 33 y.o. female.   Chief Complaint: right upper quadrant abdominal pain with nausea and vomiting HPI: this is a very pleasant Hispanic female who presents with the above-mentioned complaint. She does not speak [place]. Her family interprets. She has had several visits to the emergency department for the above-mentioned complaints starting about 2 months ago. This occurs after fatty meals. Today's pain persisted for quite a long time. She describes sharp moderate to severe right upper quadrant and epigastric abdominal pain with nausea and vomiting. Bowel movements are normal. She is otherwise without complaints.  Past Medical History  Diagnosis Date  . Gall stones     Past Surgical History  Procedure Laterality Date  . No past surgeries      Family History  Problem Relation Age of Onset  . Heart disease Father    Social History:  reports that she has never smoked. She has never used smokeless tobacco. She reports that she drinks alcohol. She reports that she does not use illicit drugs.  Allergies: No Known Allergies  Medications Prior to Admission  Medication Sig Dispense Refill  . ondansetron (ZOFRAN) 4 MG tablet Take 4 mg by mouth every 8 (eight) hours as needed (nausea).      Marland Kitchen oxyCODONE-acetaminophen (PERCOCET/ROXICET) 5-325 MG per tablet Take 1-2 tablets by mouth every 4 (four) hours as needed for severe pain.  25 tablet  0    Results for orders placed during the hospital encounter of 07/08/14 (from the past 48 hour(s))  CBC WITH DIFFERENTIAL     Status: None   Collection Time    07/08/14  4:40 PM      Result Value Ref Range   WBC 9.3  4.0 - 10.5 K/uL   RBC 4.85  3.87 - 5.11 MIL/uL   Hemoglobin 14.3  12.0 - 15.0 g/dL   HCT 41.9  36.0 - 46.0 %   MCV 86.4  78.0 - 100.0 fL   MCH 29.5  26.0 - 34.0 pg   MCHC 34.1  30.0 - 36.0 g/dL   RDW 12.5  11.5 - 15.5 %   Platelets 271  150 - 400 K/uL   Neutrophils Relative % 68  43 - 77 %   Neutro Abs 6.4   1.7 - 7.7 K/uL   Lymphocytes Relative 25  12 - 46 %   Lymphs Abs 2.3  0.7 - 4.0 K/uL   Monocytes Relative 7  3 - 12 %   Monocytes Absolute 0.6  0.1 - 1.0 K/uL   Eosinophils Relative 0  0 - 5 %   Eosinophils Absolute 0.0  0.0 - 0.7 K/uL   Basophils Relative 0  0 - 1 %   Basophils Absolute 0.0  0.0 - 0.1 K/uL  COMPREHENSIVE METABOLIC PANEL     Status: Abnormal   Collection Time    07/08/14  4:40 PM      Result Value Ref Range   Sodium 135 (*) 137 - 147 mEq/L   Potassium 4.1  3.7 - 5.3 mEq/L   Chloride 98  96 - 112 mEq/L   CO2 24  19 - 32 mEq/L   Glucose, Bld 98  70 - 99 mg/dL   BUN 15  6 - 23 mg/dL   Creatinine, Ser 0.60  0.50 - 1.10 mg/dL   Calcium 10.1  8.4 - 10.5 mg/dL   Total Protein 8.8 (*) 6.0 - 8.3 g/dL   Albumin 4.2  3.5 - 5.2 g/dL   AST 18  0 - 37 U/L   ALT 25  0 - 35 U/L   Alkaline Phosphatase 83  39 - 117 U/L   Total Bilirubin 0.5  0.3 - 1.2 mg/dL   GFR calc non Af Amer >90  >90 mL/min   GFR calc Af Amer >90  >90 mL/min   Comment: (NOTE)     The eGFR has been calculated using the CKD EPI equation.     This calculation has not been validated in all clinical situations.     eGFR's persistently <90 mL/min signify possible Chronic Kidney     Disease.   Anion gap 13  5 - 15  LIPASE, BLOOD     Status: None   Collection Time    07/08/14  4:40 PM      Result Value Ref Range   Lipase 28  11 - 59 U/L  URINALYSIS, ROUTINE W REFLEX MICROSCOPIC     Status: Abnormal   Collection Time    07/08/14  6:02 PM      Result Value Ref Range   Color, Urine YELLOW  YELLOW   APPearance CLEAR  CLEAR   Specific Gravity, Urine 1.015  1.005 - 1.030   pH 7.0  5.0 - 8.0   Glucose, UA NEGATIVE  NEGATIVE mg/dL   Hgb urine dipstick NEGATIVE  NEGATIVE   Bilirubin Urine NEGATIVE  NEGATIVE   Ketones, ur NEGATIVE  NEGATIVE mg/dL   Protein, ur NEGATIVE  NEGATIVE mg/dL   Urobilinogen, UA 1.0  0.0 - 1.0 mg/dL   Nitrite NEGATIVE  NEGATIVE   Leukocytes, UA MODERATE (*) NEGATIVE  PREGNANCY, URINE      Status: None   Collection Time    07/08/14  6:02 PM      Result Value Ref Range   Preg Test, Ur NEGATIVE  NEGATIVE   Comment:            THE SENSITIVITY OF THIS     METHODOLOGY IS >20 mIU/mL.  URINE MICROSCOPIC-ADD ON     Status: None   Collection Time    07/08/14  6:02 PM      Result Value Ref Range   Squamous Epithelial / LPF RARE  RARE   WBC, UA 3-6  <3 WBC/hpf   US Abdomen Limited Ruq  07/08/2014   CLINICAL DATA:  Right upper quadrant pain.  EXAM: US ABDOMEN LIMITED - RIGHT UPPER QUADRANT  COMPARISON:  04/22/2014  FINDINGS: Gallbladder:  The gallbladder is completely filled with stones. There is a positive sonographic Murphy's sign. Gallbladder wall thickness appears normal.  Common bile duct:  Diameter: 6.5 mm, normal.  Liver:  No focal lesion identified. Within normal limits in parenchymal echogenicity.  IMPRESSION: The gallbladder is now completely filled with stones, increased from 04/22/2014, with a positive sonographic Murphy's sign. Findings are consistent with acute cholecystitis superimposed on cholelithiasis.   Electronically Signed   By: Rozetta Nunnery M.D.   On: 07/08/2014 17:58    Review of Systems  Gastrointestinal: Positive for nausea, vomiting and abdominal pain.  All other systems reviewed and are negative.   Blood pressure 110/55, pulse 86, temperature 98.4 F (36.9 C), temperature source Oral, resp. rate 18, height 5' (1.524 m), weight 176 lb 3.2 oz (79.924 kg), last menstrual period 06/17/2014, SpO2 100.00%. Physical Exam  Constitutional: She is oriented to person, place, and time. She appears well-developed and well-nourished. No distress.  HENT:  Head: Normocephalic and atraumatic.  Right Ear: External ear normal.  Left Ear: External ear  normal.  Nose: Nose normal.  Mouth/Throat: Oropharynx is clear and moist. No oropharyngeal exudate.  Eyes: Conjunctivae are normal. Pupils are equal, round, and reactive to light. Right eye exhibits no discharge. Left eye  exhibits no discharge. No scleral icterus.  Neck: Normal range of motion. Neck supple. No tracheal deviation present.  Cardiovascular: Normal rate, regular rhythm, normal heart sounds and intact distal pulses.   No murmur heard. Respiratory: Effort normal and breath sounds normal. No respiratory distress. She has no wheezes.  GI: Soft. She exhibits no distension. There is tenderness. There is guarding.  Mild tenderness with guarding in the right quadrant  Musculoskeletal: Normal range of motion. She exhibits no edema and no tenderness.  Lymphadenopathy:    She has no cervical adenopathy.  Neurological: She is alert and oriented to person, place, and time.  Skin: Skin is warm and dry. No rash noted. She is not diaphoretic. No erythema.  Psychiatric: Her behavior is normal. Judgment normal.     Assessment/Plan Acute cholecystitis with cholelithiasis  She'll be admitted to the hospital and started on IV antibiotics and pain control. Hopefully, she will be able to proceed with a laparoscopic cholecystectomy tomorrow depending on the OR schedule.Marland KitchenMarland KitchenMarland KitchenI discussed the procedure in detail.    We discussed the risks and benefits of a laparoscopic cholecystectomy and possible cholangiogram including, but not limited to bleeding, infection, injury to surrounding structures such as the intestine or liver, bile leak, retained gallstones, need to convert to an open procedure, prolonged diarrhea, blood clots such as  DVT, common bile duct injury, anesthesia risks, and possible need for additional procedures.  The likelihood of improvement in symptoms and return to the patient's normal status is good. We discussed the typical post-operative recovery course.  Keland Peyton A 07/08/2014, 8:34 PM

## 2014-07-08 NOTE — ED Provider Notes (Signed)
CSN: [id number]     Arrival date & time 07/08/14  1441 History   First MD Initiated Contact with Patient 07/08/14 1638     Chief Complaint  Patient presents with  . Cholelithiasis     (Consider location/radiation/quality/duration/timing/severity/associated sxs/prior Treatment) HPI Comments: History obtained through translator. Patient with known history of gallstones present with nausea, right-sided abdominal pain radiating to her back it has been constant since today. Nothing makes the pain better or worse. She is unable to eat because of the pain. She had one episode of vomiting today of nonbilious and nonbloody. Denies any fever, chills, diarrhea, urinary or vaginal symptoms. She was seen in the several times previously for similar complaints. She is not yet seen a Careers adviser. She was sent by her PCP today for worsening pain and vomiting. Taking Percocet at home without relief.  The history is provided by the patient. The history is limited by a language barrier. A language interpreter was used.    Past Medical History  Diagnosis Date  . Gall stones    Past Surgical History  Procedure Laterality Date  . No past surgeries     Family History  Problem Relation Age of Onset  . Heart disease Father    History  Substance Use Topics  . Smoking status: Never Smoker   . Smokeless tobacco: Never Used  . Alcohol Use: Yes     Comment: Very infrequently.   OB History   Grav Para Term Preterm Abortions TAB SAB Ect Mult Living                 Review of Systems  Constitutional: Positive for activity change, appetite change and fatigue. Negative for fever.  HENT: Negative for congestion and rhinorrhea.   Eyes: Negative for visual disturbance.  Respiratory: Negative for cough, chest tightness and shortness of breath.   Cardiovascular: Negative for chest pain.  Gastrointestinal: Positive for nausea, vomiting and abdominal pain.  Genitourinary: Negative for dysuria and hematuria.   Musculoskeletal: Negative for arthralgias, back pain and myalgias.  Skin: Negative for rash.  Neurological: Negative for dizziness, weakness and headaches.  A complete 10 system review of systems was obtained and all systems are negative except as noted in the HPI and PMH.      Allergies  Review of patient's allergies indicates no known allergies.  Home Medications   Prior to Admission medications   Medication Sig Start Date End Date Taking? Authorizing Provider  ondansetron (ZOFRAN) 4 MG tablet Take 4 mg by mouth every 8 (eight) hours as needed (nausea). 06/28/14  Yes Raeford Razor, MD  oxyCODONE-acetaminophen (PERCOCET/ROXICET) 5-325 MG per tablet Take 1-2 tablets by mouth every 4 (four) hours as needed for severe pain. 06/28/14  Yes Raeford Razor, MD   BP 125/85  Pulse 87  Temp(Src) 98.9 F (37.2 C) (Oral)  Resp 18  SpO2 100%  LMP 06/17/2014 Physical Exam  Nursing note and vitals reviewed. Constitutional: She is oriented to person, place, and time. She appears well-developed and well-nourished. No distress.  HENT:  Head: Normocephalic and atraumatic.  Mouth/Throat: Oropharynx is clear and moist. No oropharyngeal exudate.  Eyes: Conjunctivae and EOM are normal. Pupils are equal, round, and reactive to light.  Neck: Normal range of motion. Neck supple.  No meningismus.  Cardiovascular: Normal rate, regular rhythm, normal heart sounds and intact distal pulses.   No murmur heard. Pulmonary/Chest: Effort normal and breath sounds normal. No respiratory distress.  Abdominal: Soft. There is tenderness. There is no rebound  and no guarding.  Right upper quadrant tenderness, no guarding or rebound  Musculoskeletal: Normal range of motion. She exhibits no edema and no tenderness.  Right paraspinal lumbar tenderness  Neurological: She is alert and oriented to person, place, and time. No cranial nerve deficit. She exhibits normal muscle tone. Coordination normal.  No ataxia on finger  to nose bilaterally. No pronator drift. 5/5 strength throughout. CN 2-12 intact. Negative Romberg. Equal grip strength. Sensation intact. Gait is normal.   Skin: Skin is warm.  Psychiatric: She has a normal mood and affect. Her behavior is normal.    ED Course  Procedures (including critical care time) Labs Review Labs Reviewed  COMPREHENSIVE METABOLIC PANEL - Abnormal; Notable for the following:    Sodium 135 (*)    Total Protein 8.8 (*)    All other components within normal limits  URINALYSIS, ROUTINE W REFLEX MICROSCOPIC - Abnormal; Notable for the following:    Leukocytes, UA MODERATE (*)    All other components within normal limits  CBC WITH DIFFERENTIAL  LIPASE, BLOOD  PREGNANCY, URINE  URINE MICROSCOPIC-ADD ON    Imaging Review [place] Abdomen Limited Ruq  07/08/2014   CLINICAL DATA:  Right upper quadrant pain.  EXAM: US ABDOMEN LIMITED - RIGHT UPPER QUADRANT  COMPARISON:  04/22/2014  FINDINGS: Gallbladder:  The gallbladder is completely filled with stones. There is a positive sonographic Murphy's sign. Gallbladder wall thickness appears normal.  Common bile duct:  Diameter: 6.5 mm, normal.  Liver:  No focal lesion identified. Within normal limits in parenchymal echogenicity.  IMPRESSION: The gallbladder is now completely filled with stones, increased from 04/22/2014, with a positive sonographic Murphy's sign. Findings are consistent with acute cholecystitis superimposed on cholelithiasis.   Electronically Signed   By: Geanie CooleyJim  Maxwell M.D.   On: 07/08/2014 17:58     EKG Interpretation None      MDM   Final diagnoses:  None   right upper quadrant pain with no history of gallstones. Patient with no peritoneal signs. Vitals stable. No distress.  LFTs and lipase normal. White count normal.  Ultrasound shows gallbladder with innumerable stones. Positive sonographic Murphy sign. No pericholecystic fluid. No gallbladder wall thickening.  Discussed with Dr. Magnus IvanBlackman who will evaluate  for cholecystectomy.    Glynn OctaveStephen Lyzbeth Genrich, MD 07/08/14 [phone]

## 2014-07-08 NOTE — ED Notes (Signed)
Initial contact-reports having gallstones. Has been having pain for a week. Reports pain feels the same as similar occurences that [facility]. Takes percocet prescribed for pain at home. Has not received follow-up after PCP appt for surgeon. C/o constant pain since 1200 today. Emesis x1. Denies diarrhea. No alleviating factors but eating, drinking makes pain worse. Denies blood in vomit and urine. Last menstrual cycle was 7/7. No previous abdominal surgeries. Denies vaginal bleeding, discharge. Denies SOB, chest pain. In NAD. Moving all extremities. Speaking full, clear sentences. RR even/unlabored. MD Rancour.

## 2014-07-08 NOTE — ED Notes (Addendum)
Called report to give report to [place].

## 2014-07-08 NOTE — Telephone Encounter (Signed)
Patient called about severe pain, vomiting. Patient advised to go to emergency room per Dr. Ashley RoyaltyMatthews.

## 2014-07-08 NOTE — ED Notes (Addendum)
Pt has diagnosed gallstones and presents nausea pain to right flank area wrapping around back. Pt also c/o foul taste in mouth.

## 2014-07-09 ENCOUNTER — Observation Stay (HOSPITAL_COMMUNITY): Payer: Medicaid Other

## 2014-07-09 ENCOUNTER — Encounter (HOSPITAL_COMMUNITY): Payer: Self-pay | Admitting: Certified Registered"

## 2014-07-09 ENCOUNTER — Encounter (HOSPITAL_COMMUNITY): Payer: Medicaid Other | Admitting: Anesthesiology

## 2014-07-09 ENCOUNTER — Encounter (HOSPITAL_COMMUNITY)
Admission: EM | Disposition: A | Discharge status: Home / Self Care | Payer: Self-pay | Source: Home / Self Care | Attending: Emergency Medicine

## 2014-07-09 ENCOUNTER — Observation Stay (HOSPITAL_COMMUNITY): Payer: Medicaid Other | Admitting: Anesthesiology

## 2014-07-09 DIAGNOSIS — K8010 Calculus of gallbladder with chronic cholecystitis without obstruction: Secondary | ICD-10-CM | POA: Diagnosis not present

## 2014-07-09 HISTORY — PX: CHOLECYSTECTOMY: SHX55

## 2014-07-09 LAB — COMPREHENSIVE METABOLIC PANEL
ALT: 18 U/L (ref 0–35)
AST: 12 U/L (ref 0–37)
Albumin: 3.5 g/dL (ref 3.5–5.2)
Alkaline Phosphatase: 60 U/L (ref 39–117)
Anion gap: 11 (ref 5–15)
BILIRUBIN TOTAL: 1.1 mg/dL (ref 0.3–1.2)
BUN: 11 mg/dL (ref 6–23)
CHLORIDE: 102 meq/L (ref 96–112)
CO2: 23 meq/L (ref 19–32)
Calcium: 8.9 mg/dL (ref 8.4–10.5)
Creatinine, Ser: 0.55 mg/dL (ref 0.50–1.10)
GFR calc Af Amer: >90 mL/min (ref >90)
Glucose, Bld: 83 mg/dL (ref 70–99)
POTASSIUM: 3.9 meq/L (ref 3.7–5.3)
SODIUM: 136 meq/L — AB (ref 137–147)
Total Protein: 7.1 g/dL (ref 6.0–8.3)

## 2014-07-09 SURGERY — LAPAROSCOPIC CHOLECYSTECTOMY WITH INTRAOPERATIVE CHOLANGIOGRAM
Anesthesia: General | Site: Abdomen

## 2014-07-09 MED ORDER — FENTANYL CITRATE 0.05 MG/ML IJ SOLN
INTRAMUSCULAR | Status: AC
  Filled 2014-07-09: qty 5

## 2014-07-09 MED ORDER — ONDANSETRON HCL 4 MG/2ML IJ SOLN: 4.0000 mg | Freq: Four times a day (QID) | INTRAMUSCULAR | Status: DC | PRN

## 2014-07-09 MED ORDER — FENTANYL CITRATE 0.05 MG/ML IJ SOLN
INTRAMUSCULAR | Status: DC | PRN
  Administered 2014-07-09: 50 ug via INTRAVENOUS
  Administered 2014-07-09: 100 ug via INTRAVENOUS
  Administered 2014-07-09 (×2): 50 ug via INTRAVENOUS

## 2014-07-09 MED ORDER — DEXAMETHASONE SODIUM PHOSPHATE 10 MG/ML IJ SOLN
INTRAMUSCULAR | Status: AC
  Filled 2014-07-09: qty 1

## 2014-07-09 MED ORDER — ROCURONIUM BROMIDE 100 MG/10ML IV SOLN
INTRAVENOUS | Status: DC | PRN
  Administered 2014-07-09: 25 mg via INTRAVENOUS
  Administered 2014-07-09: 5 mg via INTRAVENOUS

## 2014-07-09 MED ORDER — LIDOCAINE HCL (CARDIAC) 20 MG/ML IV SOLN
INTRAVENOUS | Status: DC | PRN
  Administered 2014-07-09: 50 mg via INTRAVENOUS

## 2014-07-09 MED ORDER — PROPOFOL 10 MG/ML IV BOLUS
INTRAVENOUS | Status: DC | PRN
  Administered 2014-07-09: 180 mg via INTRAVENOUS

## 2014-07-09 MED ORDER — PROPOFOL 10 MG/ML IV BOLUS
INTRAVENOUS | Status: AC
  Filled 2014-07-09: qty 20

## 2014-07-09 MED ORDER — ENOXAPARIN SODIUM 40 MG/0.4ML ~~LOC~~ SOLN
40.0000 mg | SUBCUTANEOUS | Status: DC
  Administered 2014-07-09: 40 mg via SUBCUTANEOUS
  Filled 2014-07-09 (×2): qty 0.4

## 2014-07-09 MED ORDER — ROCURONIUM BROMIDE 100 MG/10ML IV SOLN
INTRAVENOUS | Status: AC
Start: 2014-07-09 — End: 2014-07-09
  Filled 2014-07-09: qty 1

## 2014-07-09 MED ORDER — GLYCOPYRROLATE 0.2 MG/ML IJ SOLN
INTRAMUSCULAR | Status: DC | PRN
  Administered 2014-07-09: 0.4 mg via INTRAVENOUS

## 2014-07-09 MED ORDER — PROMETHAZINE HCL 25 MG/ML IJ SOLN
6.2500 mg | INTRAMUSCULAR | Status: DC | PRN
Start: 2014-07-09 — End: 2014-07-09

## 2014-07-09 MED ORDER — ACETAMINOPHEN 10 MG/ML IV SOLN
1000.0000 mg | Freq: Once | INTRAVENOUS | Status: AC
  Administered 2014-07-09: 1000 mg via INTRAVENOUS
  Filled 2014-07-09: qty 100

## 2014-07-09 MED ORDER — LIDOCAINE HCL (PF) 1 % IJ SOLN
INTRAMUSCULAR | Status: DC | PRN
  Administered 2014-07-09: 5 mL

## 2014-07-09 MED ORDER — LIDOCAINE HCL (CARDIAC) 20 MG/ML IV SOLN
INTRAVENOUS | Status: AC
  Filled 2014-07-09: qty 5

## 2014-07-09 MED ORDER — ONDANSETRON HCL 4 MG PO TABS: 4.0000 mg | ORAL_TABLET | Freq: Three times a day (TID) | ORAL | Status: DC | PRN

## 2014-07-09 MED ORDER — OXYCODONE-ACETAMINOPHEN 5-325 MG PO TABS
1.0000 | ORAL_TABLET | ORAL | Status: DC | PRN
  Administered 2014-07-09: 2 via ORAL
  Administered 2014-07-09: 1 via ORAL
  Filled 2014-07-09: qty 1
  Filled 2014-07-09: qty 2

## 2014-07-09 MED ORDER — DEXAMETHASONE SODIUM PHOSPHATE 10 MG/ML IJ SOLN
INTRAMUSCULAR | Status: DC | PRN
  Administered 2014-07-09: 10 mg via INTRAVENOUS

## 2014-07-09 MED ORDER — BUPIVACAINE-EPINEPHRINE (PF) 0.25% -1:200000 IJ SOLN
INTRAMUSCULAR | Status: DC | PRN
  Administered 2014-07-09: 45 mL

## 2014-07-09 MED ORDER — LACTATED RINGERS IV SOLN: INTRAVENOUS | Status: DC

## 2014-07-09 MED ORDER — PIPERACILLIN-TAZOBACTAM 3.375 G IVPB
3.3750 g | Freq: Three times a day (TID) | INTRAVENOUS | Status: AC
  Administered 2014-07-09 (×2): 3.375 g via INTRAVENOUS
  Filled 2014-07-09 (×2): qty 50

## 2014-07-09 MED ORDER — HYDROMORPHONE HCL PF 1 MG/ML IJ SOLN: 1.0000 mg | INTRAMUSCULAR | Status: DC | PRN

## 2014-07-09 MED ORDER — KETOROLAC TROMETHAMINE 30 MG/ML IJ SOLN
30.0000 mg | Freq: Once | INTRAMUSCULAR | Status: AC
  Administered 2014-07-09: 30 mg via INTRAVENOUS

## 2014-07-09 MED ORDER — NEOSTIGMINE METHYLSULFATE 10 MG/10ML IV SOLN
INTRAVENOUS | Status: AC
  Filled 2014-07-09: qty 1

## 2014-07-09 MED ORDER — NEOSTIGMINE METHYLSULFATE 10 MG/10ML IV SOLN
INTRAVENOUS | Status: DC | PRN
  Administered 2014-07-09: 3 mg via INTRAVENOUS

## 2014-07-09 MED ORDER — BUPIVACAINE-EPINEPHRINE (PF) 0.25% -1:200000 IJ SOLN
INTRAMUSCULAR | Status: AC
  Filled 2014-07-09: qty 30

## 2014-07-09 MED ORDER — GLYCOPYRROLATE 0.2 MG/ML IJ SOLN
INTRAMUSCULAR | Status: AC
  Filled 2014-07-09: qty 3

## 2014-07-09 MED ORDER — SUCCINYLCHOLINE CHLORIDE 20 MG/ML IJ SOLN
INTRAMUSCULAR | Status: DC | PRN
  Administered 2014-07-09: 100 mg via INTRAVENOUS

## 2014-07-09 MED ORDER — HYDROMORPHONE HCL PF 1 MG/ML IJ SOLN
0.2500 mg | INTRAMUSCULAR | Status: DC | PRN
  Administered 2014-07-09: 0.25 mg via INTRAVENOUS
  Administered 2014-07-09: 0.5 mg via INTRAVENOUS
  Administered 2014-07-09: 0.25 mg via INTRAVENOUS

## 2014-07-09 MED ORDER — ONDANSETRON HCL 4 MG/2ML IJ SOLN
INTRAMUSCULAR | Status: AC
  Filled 2014-07-09: qty 2

## 2014-07-09 MED ORDER — LIDOCAINE HCL 1 % IJ SOLN
INTRAMUSCULAR | Status: AC
  Filled 2014-07-09: qty 20

## 2014-07-09 MED ORDER — KETOROLAC TROMETHAMINE 30 MG/ML IJ SOLN
INTRAMUSCULAR | Status: AC
  Filled 2014-07-09: qty 1

## 2014-07-09 MED ORDER — HYDROMORPHONE HCL PF 1 MG/ML IJ SOLN
INTRAMUSCULAR | Status: AC
  Filled 2014-07-09: qty 1

## 2014-07-09 MED ORDER — IOHEXOL 300 MG/ML  SOLN
INTRAMUSCULAR | Status: DC | PRN
  Administered 2014-07-09: 3 mL

## 2014-07-09 MED ORDER — ONDANSETRON HCL 4 MG/2ML IJ SOLN
INTRAMUSCULAR | Status: DC | PRN
  Administered 2014-07-09: 4 mg via INTRAVENOUS

## 2014-07-09 MED ORDER — 0.9 % SODIUM CHLORIDE (POUR BTL) OPTIME
TOPICAL | Status: DC | PRN
  Administered 2014-07-09: 1000 mL

## 2014-07-09 MED ORDER — LACTATED RINGERS IV SOLN
INTRAVENOUS | Status: DC | PRN
  Administered 2014-07-09 (×2): via INTRAVENOUS

## 2014-07-09 MED ORDER — MIDAZOLAM HCL 2 MG/2ML IJ SOLN
INTRAMUSCULAR | Status: AC
  Filled 2014-07-09: qty 2

## 2014-07-09 MED ORDER — LACTATED RINGERS IR SOLN
Status: DC | PRN
  Administered 2014-07-09: 1000 mL

## 2014-07-09 MED ORDER — MIDAZOLAM HCL 5 MG/5ML IJ SOLN
INTRAMUSCULAR | Status: DC | PRN
  Administered 2014-07-09: 2 mg via INTRAVENOUS

## 2014-07-09 MED ORDER — KETOROLAC TROMETHAMINE 30 MG/ML IJ SOLN: 15.0000 mg | Freq: Once | INTRAMUSCULAR | Status: DC | PRN

## 2014-07-09 SURGICAL SUPPLY — 34 items
APPLIER CLIP ROT 10 11.4 M/L (STAPLE) ×3
CANISTER SUCTION 2500CC (MISCELLANEOUS) ×6 IMPLANT
CHLORAPREP W/TINT 26ML (MISCELLANEOUS) ×3 IMPLANT
CLIP APPLIE ROT 10 11.4 M/L (STAPLE) ×1 IMPLANT
CLIP LIGATING HEMO O LOK GREEN (MISCELLANEOUS) IMPLANT
COVER MAYO STAND STRL (DRAPES) ×3 IMPLANT
DECANTER SPIKE VIAL GLASS SM (MISCELLANEOUS) ×3 IMPLANT
DERMABOND ADVANCED (GAUZE/BANDAGES/DRESSINGS) ×2
DERMABOND ADVANCED .7 DNX12 (GAUZE/BANDAGES/DRESSINGS) ×1 IMPLANT
DRAPE C-ARM 42X120 X-RAY (DRAPES) ×3 IMPLANT
DRAPE LAPAROSCOPIC ABDOMINAL (DRAPES) ×3 IMPLANT
ELECT REM PT RETURN 9FT ADLT (ELECTROSURGICAL) ×3
ELECTRODE REM PT RTRN 9FT ADLT (ELECTROSURGICAL) ×1 IMPLANT
GLOVE BIO SURGEON STRL SZ 6 (GLOVE) ×3 IMPLANT
GLOVE INDICATOR 6.5 STRL GRN (GLOVE) ×3 IMPLANT
GOWN STRL REUS W/TWL 2XL LVL3 (GOWN DISPOSABLE) ×3 IMPLANT
GOWN STRL REUS W/TWL XL LVL3 (GOWN DISPOSABLE) ×6 IMPLANT
HEMOSTAT SNOW SURGICEL 2X4 (HEMOSTASIS) IMPLANT
KIT BASIN OR (CUSTOM PROCEDURE TRAY) ×3 IMPLANT
POUCH SPECIMEN RETRIEVAL 10MM (ENDOMECHANICALS) ×3 IMPLANT
SET CHOLANGIOGRAPH MIX (MISCELLANEOUS) ×3 IMPLANT
SET IRRIG TUBING LAPAROSCOPIC (IRRIGATION / IRRIGATOR) ×3 IMPLANT
SOLUTION ANTI FOG 6CC (MISCELLANEOUS) ×3 IMPLANT
SUT MNCRL AB 4-0 PS2 18 (SUTURE) ×3 IMPLANT
TOWEL OR 17X26 10 PK STRL BLUE (TOWEL DISPOSABLE) ×3 IMPLANT
TOWEL OR NON WOVEN STRL DISP B (DISPOSABLE) ×3 IMPLANT
TRAY LAP CHOLE (CUSTOM PROCEDURE TRAY) ×3 IMPLANT
TROCAR BLADELESS OPT 5 100 (ENDOMECHANICALS) ×3 IMPLANT
TROCAR BLADELESS OPT 5 75 (ENDOMECHANICALS) IMPLANT
TROCAR SLEEVE XCEL 5X75 (ENDOMECHANICALS) IMPLANT
TROCAR XCEL BLADELESS 5X75MML (TROCAR) ×6 IMPLANT
TROCAR XCEL BLUNT TIP 100MML (ENDOMECHANICALS) ×3 IMPLANT
TROCAR XCEL NON-BLD 11X100MML (ENDOMECHANICALS) ×3 IMPLANT
TUBING INSUFFLATION 10FT LAP (TUBING) ×3 IMPLANT

## 2014-07-09 NOTE — Anesthesia Preprocedure Evaluation (Signed)

## 2014-07-09 NOTE — Progress Notes (Signed)
Patient ID: Christy Boyer, female   DOB: 1981-11-04, 33 y.o.   MRN: [id number]  Subjective: States she is pain free today.  VSS.  Afebrile.  Stable labs.  She denies sob, cp, palpitations.  Denies n/v.  Denies dysuria.    Objective:  Vital signs:  Filed Vitals:   07/08/14 2000 07/08/14 2156 07/09/14 0225 07/09/14 0513  BP: 110/55 102/66 109/66 99/65  Pulse: 86 70 64 78  Temp: 98.4 F (36.9 C) 98.2 F (36.8 C) 97.9 F (36.6 C) 98.3 F (36.8 C)  TempSrc: Oral Oral Oral Oral  Resp: 18 16 14 16   Height: 5' (1.524 m)     Weight: 176 lb 3.2 oz (79.924 kg)     SpO2: 100% 100% 100% 100%       Intake/Output   Yesterday:  07/28 0701 - 07/29 0700 In: 1272.9 [I.V.:1272.9] Out: 600 [Urine:600] This shift:    I/O last 3 completed shifts: In: 1272.9 [I.V.:1272.9] Out: 600 [Urine:600]       Physical Exam:  General: Pt awake/alert/oriented x4 in no acute distress Psych:  No delerium/psychosis/paranoia Chest: cta. No chest wall pain w good excursion CV:  Pulses intact.  Regular rhythm MS: Normal AROM mjr joints.  No obvious deformity Abdomen: Soft.  Nondistended.  Nontender.  No evidence of peritonitis.  No incarcerated hernias. Ext:  SCDs BLE.  No mjr edema.  No cyanosis Skin: No petechiae / purpura   Problem List:   Active Problems:   Cholelithiasis with acute cholecystitis    Results:   Labs: Results for orders placed during the hospital encounter of 07/08/14 (from the past 48 hour(s))  CBC WITH DIFFERENTIAL     Status: None   Collection Time    07/08/14  4:40 PM      Result Value Ref Range   WBC 9.3  4.0 - 10.5 K/uL   RBC 4.85  3.87 - 5.11 MIL/uL   Hemoglobin 14.3  12.0 - 15.0 g/dL   HCT 41.9  36.0 - 46.0 %   MCV 86.4  78.0 - 100.0 fL   MCH 29.5  26.0 - 34.0 pg   MCHC 34.1  30.0 - 36.0 g/dL   RDW 12.5  11.5 - 15.5 %   Platelets 271  150 - 400 K/uL   Neutrophils Relative % 68  43 - 77 %   Neutro Abs 6.4  1.7 - 7.7 K/uL   Lymphocytes Relative  25  12 - 46 %   Lymphs Abs 2.3  0.7 - 4.0 K/uL   Monocytes Relative 7  3 - 12 %   Monocytes Absolute 0.6  0.1 - 1.0 K/uL   Eosinophils Relative 0  0 - 5 %   Eosinophils Absolute 0.0  0.0 - 0.7 K/uL   Basophils Relative 0  0 - 1 %   Basophils Absolute 0.0  0.0 - 0.1 K/uL  COMPREHENSIVE METABOLIC PANEL     Status: Abnormal   Collection Time    07/08/14  4:40 PM      Result Value Ref Range   Sodium 135 (*) 137 - 147 mEq/L   Potassium 4.1  3.7 - 5.3 mEq/L   Chloride 98  96 - 112 mEq/L   CO2 24  19 - 32 mEq/L   Glucose, Bld 98  70 - 99 mg/dL   BUN 15  6 - 23 mg/dL   Creatinine, Ser 0.60  0.50 - 1.10 mg/dL   Calcium 10.1  8.4 - 10.5 mg/dL   Total  Protein 8.8 (*) 6.0 - 8.3 g/dL   Albumin 4.2  3.5 - 5.2 g/dL   AST 18  0 - 37 U/L   ALT 25  0 - 35 U/L   Alkaline Phosphatase 83  39 - 117 U/L   Total Bilirubin 0.5  0.3 - 1.2 mg/dL   GFR calc non Af Amer >90  >90 mL/min   GFR calc Af Amer >90  >90 mL/min   Comment: (NOTE)     The eGFR has been calculated using the CKD EPI equation.     This calculation has not been validated in all clinical situations.     eGFR's persistently <90 mL/min signify possible Chronic Kidney     Disease.   Anion gap 13  5 - 15  LIPASE, BLOOD     Status: None   Collection Time    07/08/14  4:40 PM      Result Value Ref Range   Lipase 28  11 - 59 U/L  URINALYSIS, ROUTINE W REFLEX MICROSCOPIC     Status: Abnormal   Collection Time    07/08/14  6:02 PM      Result Value Ref Range   Color, Urine YELLOW  YELLOW   APPearance CLEAR  CLEAR   Specific Gravity, Urine 1.015  1.005 - 1.030   pH 7.0  5.0 - 8.0   Glucose, UA NEGATIVE  NEGATIVE mg/dL   Hgb urine dipstick NEGATIVE  NEGATIVE   Bilirubin Urine NEGATIVE  NEGATIVE   Ketones, ur NEGATIVE  NEGATIVE mg/dL   Protein, ur NEGATIVE  NEGATIVE mg/dL   Urobilinogen, UA 1.0  0.0 - 1.0 mg/dL   Nitrite NEGATIVE  NEGATIVE   Leukocytes, UA MODERATE (*) NEGATIVE  PREGNANCY, URINE     Status: None   Collection Time     07/08/14  6:02 PM      Result Value Ref Range   Preg Test, Ur NEGATIVE  NEGATIVE   Comment:            THE SENSITIVITY OF THIS     METHODOLOGY IS >20 mIU/mL.  URINE MICROSCOPIC-ADD ON     Status: None   Collection Time    07/08/14  6:02 PM      Result Value Ref Range   Squamous Epithelial / LPF RARE  RARE   WBC, UA 3-6  <3 WBC/hpf  SURGICAL PCR SCREEN     Status: None   Collection Time    07/08/14  9:40 PM      Result Value Ref Range   MRSA, PCR NEGATIVE  NEGATIVE   Staphylococcus aureus NEGATIVE  NEGATIVE   Comment:            The Xpert SA Assay (FDA     approved for NASAL specimens     in patients over 65 years of age),     is one component of     a comprehensive surveillance     program.  Test performance has     been validated by [employer] for patients greater     than or equal to 47 year old.     It is not intended     to diagnose infection nor to     guide or monitor treatment.  COMPREHENSIVE METABOLIC PANEL     Status: Abnormal   Collection Time    07/09/14  5:12 AM      Result Value Ref Range   Sodium 136 (*)  137 - 147 mEq/L   Potassium 3.9  3.7 - 5.3 mEq/L   Chloride 102  96 - 112 mEq/L   CO2 23  19 - 32 mEq/L   Glucose, Bld 83  70 - 99 mg/dL   BUN 11  6 - 23 mg/dL   Creatinine, Ser 0.55  0.50 - 1.10 mg/dL   Calcium 8.9  8.4 - 10.5 mg/dL   Total Protein 7.1  6.0 - 8.3 g/dL   Albumin 3.5  3.5 - 5.2 g/dL   AST 12  0 - 37 U/L   ALT 18  0 - 35 U/L   Alkaline Phosphatase 60  39 - 117 U/L   Total Bilirubin 1.1  0.3 - 1.2 mg/dL   GFR calc non Af Amer >90  >90 mL/min   GFR calc Af Amer >90  >90 mL/min   Comment: (NOTE)     The eGFR has been calculated using the CKD EPI equation.     This calculation has not been validated in all clinical situations.     eGFR's persistently <90 mL/min signify possible Chronic Kidney     Disease.   Anion gap 11  5 - 15    Imaging / Studies: US Abdomen Limited Ruq  July 12, 2014   CLINICAL DATA:  Right upper quadrant  pain.  EXAM: US ABDOMEN LIMITED - RIGHT UPPER QUADRANT  COMPARISON:  04/22/2014  FINDINGS: Gallbladder:  The gallbladder is completely filled with stones. There is a positive sonographic Murphy's sign. Gallbladder wall thickness appears normal.  Common bile duct:  Diameter: 6.5 mm, normal.  Liver:  No focal lesion identified. Within normal limits in parenchymal echogenicity.  IMPRESSION: The gallbladder is now completely filled with stones, increased from 04/22/2014, with a positive sonographic Murphy's sign. Findings are consistent with acute cholecystitis superimposed on cholelithiasis.   Electronically Signed   By: Rozetta Nunnery M.D.   On: 07/12/2014 17:58    Scheduled Meds: . enoxaparin (LOVENOX) injection  40 mg Subcutaneous Q24H  . piperacillin-tazobactam (ZOSYN)  IV  3.375 g Intravenous 3 times per day   Continuous Infusions: . 0.9 % NaCl with KCl 20 mEq / L 125 mL/hr at 07-12-2014 2027   PRN Meds:.HYDROmorphone (DILAUDID) injection, ondansetron   Antibiotics: Anti-infectives   Start     Dose/Rate Route Frequency Ordered Stop   07-12-14 2200  piperacillin-tazobactam (ZOSYN) IVPB 3.375 g     3.375 g 12.5 mL/hr over 240 Minutes Intravenous 3 times per day 2014/07/12 1959        Assessment/Plan Acute cholecystitis with cholelithiasis -proceed with a laparoscopic cholecystectomy with IOC at 9AM with Dr. Barry Dienes   -we communicated via interpreter.  She verbalizes understanding of risks and benefits of the surgery including but not limited to infection, bleeding, injury to surrounding structures.  She wishes to proceed. -she has been NPO, consent is signed, lovenox last given 2100. -zosyn -pain control/antiemetics -SCD, post op lovenox -IVF  Erby Pian, [facility] Surgery Pager [phone] Office [phone]  07/09/2014 7:14 AM

## 2014-07-09 NOTE — Anesthesia Postprocedure Evaluation (Signed)
  Anesthesia Post-op Note  Patient: Education officer, [name]  Procedure(s) Performed: Procedure(s) (LRB): LAPAROSCOPIC CHOLECYSTECTOMY WITH INTRAOPERATIVE CHOLANGIOGRAM (N/A)  Patient Location: PACU  Anesthesia Type: General  Level of Consciousness: awake and alert   Airway and Oxygen Therapy: Patient Spontanous Breathing  Post-op Pain: mild  Post-op Assessment: Post-op Vital signs reviewed, Patient's Cardiovascular Status Stable, Respiratory Function Stable, Patent Airway and No signs of Nausea or vomiting  Last Vitals:  Filed Vitals:   07/09/14 1030  BP: 125/69  Pulse: 63  Temp:   Resp: 17    Post-op Vital Signs: stable   Complications: No apparent anesthesia complications

## 2014-07-09 NOTE — Op Note (Signed)
Laparoscopic Cholecystectomy with IOC Procedure Note  Indications: This patient presents with acute cholecystitis and will undergo laparoscopic cholecystectomy.  Pre-operative Diagnosis: acute calculous cholecystitis  Post-operative Diagnosis: Same  Surgeon: Almond Lint   Assistants: n/a  Anesthesia: General endotracheal anesthesia and local  ASA Class: 1E  Procedure Details  The patient was seen again in the Holding Room. The risks, benefits, complications, treatment options, and expected outcomes were discussed with the patient. The possibilities of  bleeding, recurrent infection, damage to nearby structures, the need for additional procedures, failure to diagnose a condition, the possible need to convert to an open procedure, and creating a complication requiring transfusion or operation were discussed with the patient. The likelihood of improving the patient's symptoms with return to their baseline status is good.    The patient and/or family concurred with the proposed plan, giving informed consent. The site of surgery properly noted. The patient was taken to Operating Room, and the procedure verified as Laparoscopic Cholecystectomy with Intraoperative Cholangiogram. A Time Out was held and the above information confirmed.  Prior to the induction of general anesthesia, antibiotic prophylaxis was administered. General endotracheal anesthesia was then administered and tolerated well. After the induction, the abdomen was prepped with Chloraprep and draped in the sterile fashion. The patient was positioned in the supine position.  Local anesthetic agent was injected into the skin near the umbilicus and an incision made. We dissected down to the abdominal fascia with blunt dissection.  The fascia was incised vertically and we entered the peritoneal cavity bluntly.  A pursestring suture of 0-Vicryl was placed around the fascial opening.  The Hasson cannula was inserted and secured with the  stay suture.  Pneumoperitoneum was then created with CO2 and tolerated well without any adverse changes in the patient's vital signs. An 11-mm port was placed in the subxiphoid position.  Two 5-mm ports were placed in the right upper quadrant. All skin incisions were infiltrated with a local anesthetic agent before making the incision and placing the trocars.   We positioned the patient in reverse Trendelenburg, tilted slightly to the patient's left.  The gallbladder was identified, the fundus grasped and retracted cephalad. Adhesions were lysed bluntly and with the electrocautery where indicated, taking care not to injure any adjacent organs or viscus. The infundibulum was grasped and retracted laterally, exposing the peritoneum overlying the triangle of Calot. This was then divided and exposed in a blunt fashion. A critical view of the cystic duct and cystic artery was obtained.  The cystic duct was clearly identified and bluntly dissected circumferentially. The cystic duct was ligated with a clip distally.   An incision was made in the cystic duct and the [facility] cholangiogram catheter introduced. The catheter was secured using a clip. A cholangiogram was then performed, demonstrating good filling of common bile duct, proper hepatic duct, left and right hepatic ducts and duodenum.  No filling defects were seen.  The cystic duct was then ligated with clips and divided. The cystic artery was identified, dissected free, ligated with clips and divided as well. There were several other small bridging veins entering the gallbladder that were also clipped.    The gallbladder was dissected from the liver bed in retrograde fashion with the electrocautery. The gallbladder was removed and placed in an Endocatch bag.  The gallbladder and Endocatch bag were then removed through the umbilical port site.  The liver bed was irrigated and inspected. Hemostasis was achieved with the electrocautery. Copious irrigation was  utilized and was  repeatedly aspirated until clear.    We again inspected the right upper quadrant for hemostasis.  Pneumoperitoneum was released as we removed the trocars.   The pursestring suture was used to close the umbilical fascia.  4-0 Monocryl was used to close the skin.   The skin was cleaned and dry, and Dermabond was applied. The patient was then extubated and brought to the recovery room in stable condition. Instrument, sponge, and needle counts were correct at closure and at the conclusion of the case.   Findings: Acute infection with stones.    Estimated Blood Loss: minimal         Drains: none          Specimens: Gallbladder to pathology       Complications: None; patient tolerated the procedure well.         Disposition: PACU - hemodynamically stable.         Condition: stable

## 2014-07-09 NOTE — Progress Notes (Signed)
Seen, agree with above.  Home tomorrow if doing OK.

## 2014-07-09 NOTE — Progress Notes (Signed)
Utilization Review Completed.Lovett Coffin T7/29/2015  

## 2014-07-09 NOTE — Transfer of Care (Signed)
Immediate Anesthesia Transfer of Care Note  Patient: Christy MonksEstrella Boyer  Procedure(s) Performed: Procedure(s): LAPAROSCOPIC CHOLECYSTECTOMY WITH INTRAOPERATIVE CHOLANGIOGRAM (N/A)  Patient Location: PACU  Anesthesia Type:General  Level of Consciousness: awake, alert  and oriented  Airway & Oxygen Therapy: Patient Spontanous Breathing and Patient connected to face mask oxygen  Post-op Assessment: Report given to PACU RN and Post -op Vital signs reviewed and stable  Post vital signs: Reviewed and stable  Complications: No apparent anesthesia complications

## 2014-07-09 NOTE — Interval H&P Note (Signed)
History and Physical Interval Note:  07/09/2014 8:56 AM  Su MonksEstrella Boyer  has presented today for surgery, with the diagnosis of Gallstones  The various methods of treatment have been discussed with the patient and family. After consideration of risks, benefits and other options for treatment, the patient has consented to  Procedure(s): LAPAROSCOPIC CHOLECYSTECTOMY WITH INTRAOPERATIVE CHOLANGIOGRAM (N/A) as a surgical intervention .  The patient's history has been reviewed, patient examined, no change in status, stable for surgery.  I have reviewed the patient's chart and labs.  Questions were answered to the patient's satisfaction.     Ankit Degregorio

## 2014-07-10 ENCOUNTER — Encounter (HOSPITAL_COMMUNITY): Payer: Self-pay | Admitting: General Surgery

## 2014-07-10 MED ORDER — OXYCODONE-ACETAMINOPHEN 5-325 MG PO TABS: 1.0000 | ORAL_TABLET | ORAL | Status: DC | PRN

## 2014-07-10 NOTE — Discharge Instructions (Signed)
CIRUGIA LAPAROSCOPICA: INSTRUCCIONES DE POST OPERATORIO. ° °Revise siempre los documentos que le entreguen en el lugar donde se ha hecho la cirugia. ° °SI USTED NECESITA DOCUMENTOS DE INCAPACIDAD (DISABLE) O DE PERMISO FAMILAR (FAMILY LEAVE) NECESITA TRAERLOS A LA OFICINA PARA QUE SEAN PROCESADOS. °NO  SE LOS DE A SU DOCTOR. °1. A su alta del hospital se le dara una receta para controlar el dolor. Tomela como ha sido recetada, si la necesita. Si no la necesita puede tomar, Acetaminofen (Tylenol) o Ibuprofen (Advil) para aliviar dolor moderado. °2. Continue tomando el resto de sus medicinas. °3. Si necesita rellenar la receta, llame a la farmacia. ellos contactan a nuestra oficina pidiendo autorizacion. Este tipo de receta no pueden ser rellenadas despues de las  5pm o durante los fines de semana. °4. Con relacion a la dieta: debe ser ligera los primeros dias despues que llege a la casa. Ejemplo: sopas y galleticas. Tome bastante liquido esos dias. °5. La mayoria de los pacientes padecen de inflamacion y cambio de coloracion de la piel alrededor de las incisiones. esto toma dias en resolver.  pnerse una bolsa de hielo en el area affectada ayuda..  °6. Es comun tambien tener un poco de estrenimiento si esta tomado medicinas para el dolor. incremente la cantidad de liquidos a tomar y puede tomar (Colace) esto previene el problema. Si ya tiene estrenimiento, es decir no ha defecado en 48 horas, puede tomar un laxativo (Milk of Magnesia or Miralax) uselo como el paquete le explica. °7.  A menos que se le diga algo diferente. Remueva el bendaje a las 24-48 horas despues dela cirugia. y puede banarse en la ducha sin ningun problema. usted puede tener steri-strips (pequenas curitas transparentes en la piel puesta encima de la incision)  Estas banditas strips should be left on the skin for 7-10 days.   Si su cirujano puso pegamento encima de la incision usted puede banarse bajo la ducha en 24 horas. Este pegamento empezara a  caerse en las proximas 2-3 semanas. Si le pusieron suturas o presillas (grapos) estos seran quitados en su proxima cita en la oficina. . °a. ACTIVIDADES:  Puede hacer actividad ligera.  Como caminar , subir escaleras y poco a poco irlas incrementando tanto como las tolere. Puede tener relaciones sexuales cuando sea comfortable. No carge objetos pesados o haga esfuerzos que no sean aprovados por su doctor. °b. Puede manejar en cuanto no esta tomando medicamentos fuertes (narcoticos) para el dolor, pueda abrochar confortablemente el cinturon de seguridad, y pueda maniobrar y usar los pedales de su vehiculo con seguridad. °c. PUEDE REGRESAR A TRABAJAR  °8. Debe ver a su doctor para una cita de seguimiento en 2-3 semanas despues de la cirugia.  °9. OTRAS ISNSTRUCCIONES:___________________________________________________________________________________ °CUANDO LLAMAR A SU MEDICO: °1. FIEBRE mayor de  101.0 °2. No produccion de orina. °3. Sangramiento continue de la herida °4. Incremento de dolor, enrojecimientio o drenaje de la herida (incision) °5. Incremento de dolor abdominal. ° °The clinic staff is available to answer your questions during regular business hours.  Please don’t hesitate to call and ask to speak to one of the nurses for clinical concerns.  If you have a medical emergency, go to the nearest emergency room or call 911.  A surgeon from [facility] is always on call at the hospital. °[address], [address], [place], [place]  [address] ? [address] [address], [place], [place]   [address] °[phone] ? [phone] ? FAX [phone] °Web site: [email or web address] ° ° °

## 2014-07-10 NOTE — Discharge Summary (Signed)
Seen, agree with above.   Home after lap chole.  Doing well.

## 2014-07-10 NOTE — Discharge Summary (Signed)
Patient ID: Christy Boyer MRN: [id number] DOB/AGE: 04-17-1981 33 y.o.  Admit date: 07/08/2014 Discharge date: 07/10/2014  Procedures: Laparoscopic cholecystectomy with intraoperative cholangiogram  Consults: None  Reason for Admission: this is a very pleasant Hispanic female who presents with the above-mentioned complaint. She does not speak [place]. Her family interprets. She has had several visits to the emergency department for the above-mentioned complaints starting about 2 months ago. This occurs after fatty meals. Today's pain persisted for quite a long time. She describes sharp moderate to severe right upper quadrant and epigastric abdominal pain with nausea and vomiting. Bowel movements are normal. She is otherwise without complaints.  Admission Diagnoses:  1. Acute cholecystitis with cholelithiasis  Hospital Course: The patient was admitted and placed on IV antibiotic therapy. She was taken to the operating room the following day where she underwent the above listed procedure. The patient tolerated the procedure well. On postoperative day 1 her pain was well-controlled. She was tolerating a regular diet and was otherwise stable for discharge home.  PE: Abdomen: Soft, appropriately tender, active bowel sounds, nondistended, incisions are clean, dry, and intact with Dermabond in place  Discharge Diagnoses:  Active Problems:   Cholelithiasis with acute cholecystitis  status post laparoscopic cholecystectomy with IOC  Discharge Medications:   Medication List         ondansetron 4 MG tablet  Commonly known as:  ZOFRAN  Take 4 mg by mouth every 8 (eight) hours as needed (nausea).     oxyCODONE-acetaminophen 5-325 MG per tablet  Commonly known as:  PERCOCET/ROXICET  Take 1-2 tablets by mouth every 4 (four) hours as needed for severe pain.        Discharge Instructions:     Follow-up Information   Follow up with [facility] On 08/05/2014. (2:45,  arrive at 2:15pm for paperwork)    Contact information:   [address]   [place] [address] [phone]       Signed: Letha CapeOSBORNE,Caroline Longie E 07/10/2014, 9:50 AM

## 2014-08-05 ENCOUNTER — Ambulatory Visit (INDEPENDENT_AMBULATORY_CARE_PROVIDER_SITE_OTHER): Payer: No Typology Code available for payment source | Admitting: General Surgery

## 2014-08-05 ENCOUNTER — Encounter (INDEPENDENT_AMBULATORY_CARE_PROVIDER_SITE_OTHER): Payer: Self-pay | Admitting: General Surgery

## 2014-08-05 VITALS — BP 110/70 | HR 72 | Temp 98.5°F | Resp 14 | Ht 60.0 in | Wt 177.6 lb

## 2014-08-05 DIAGNOSIS — K8010 Calculus of gallbladder with chronic cholecystitis without obstruction: Secondary | ICD-10-CM

## 2014-08-05 NOTE — Patient Instructions (Signed)
Follow up as needed

## 2014-08-05 NOTE — Progress Notes (Signed)
Christy Boyer [birth year] [id number] 08/05/2014   Christy Boyer is a 33 y.o. female who had a laparoscopic cholecystectomy with intraoperative cholangiogram by Dr. Almond Lint.  The pathology report confirmed chronic cholecystitis with cholelithiasis.  The patient reports that they are feeling well with normal bowel movements and good appetite.  The pre-operative symptoms of abdominal pain, nausea, and vomiting have resolved.    Physical examination - Incisions appear well-healed with no sign of infection or bleeding.   Abdomen - soft, non-tender  Impression:  s/p laparoscopic cholecystectomy  Plan:  She may resume a regular diet and full activity.  She may follow-up on a PRN basis.

## 2014-11-14 ENCOUNTER — Ambulatory Visit: Payer: No Typology Code available for payment source | Attending: Family Medicine

## 2015-06-18 ENCOUNTER — Encounter: Payer: No Typology Code available for payment source | Admitting: Family Medicine

## 2017-05-24 ENCOUNTER — Encounter (HOSPITAL_COMMUNITY): Payer: Self-pay | Admitting: *Deleted

## 2017-05-24 ENCOUNTER — Emergency Department (HOSPITAL_COMMUNITY)
Admission: EM | Admit: 2017-05-24 | Discharge: 2017-05-24 | Disposition: A | Discharge status: Home / Self Care | Payer: Self-pay | Attending: Emergency Medicine | Admitting: Emergency Medicine

## 2017-05-24 ENCOUNTER — Emergency Department (HOSPITAL_COMMUNITY): Payer: Self-pay

## 2017-05-24 DIAGNOSIS — R1011 Right upper quadrant pain: Secondary | ICD-10-CM

## 2017-05-24 LAB — COMPREHENSIVE METABOLIC PANEL
ALBUMIN: 4 g/dL (ref 3.5–5.0)
ALT: 44 U/L (ref 14–54)
AST: 51 U/L — ABNORMAL HIGH (ref 15–41)
Alkaline Phosphatase: 69 U/L (ref 38–126)
Anion gap: 8 (ref 5–15)
BUN: 12 mg/dL (ref 6–20)
CHLORIDE: 106 mmol/L (ref 101–111)
CO2: 22 mmol/L (ref 22–32)
CREATININE: 0.6 mg/dL (ref 0.44–1.00)
Calcium: 8.7 mg/dL — ABNORMAL LOW (ref 8.9–10.3)
GFR calc Af Amer: >60 mL/min (ref >60)
GFR calc non Af Amer: >60 mL/min (ref >60)
GLUCOSE: 95 mg/dL (ref 65–99)
Potassium: 3.5 mmol/L (ref 3.5–5.1)
Sodium: 136 mmol/L (ref 135–145)
Total Bilirubin: 0.9 mg/dL (ref 0.3–1.2)
Total Protein: 7.2 g/dL (ref 6.5–8.1)

## 2017-05-24 LAB — CBC
HCT: 41.5 % (ref 36.0–46.0)
HEMOGLOBIN: 13.5 g/dL (ref 12.0–15.0)
MCH: 28.6 pg (ref 26.0–34.0)
MCHC: 32.5 g/dL (ref 30.0–36.0)
MCV: 87.9 fL (ref 78.0–100.0)
Platelets: 222 10*3/uL (ref 150–400)
RBC: 4.72 MIL/uL (ref 3.87–5.11)
RDW: 13.2 % (ref 11.5–15.5)
WBC: 6.7 10*3/uL (ref 4.0–10.5)

## 2017-05-24 LAB — URINALYSIS, ROUTINE W REFLEX MICROSCOPIC
Bilirubin Urine: NEGATIVE
GLUCOSE, UA: NEGATIVE mg/dL
Ketones, ur: NEGATIVE mg/dL
Leukocytes, UA: NEGATIVE
Nitrite: NEGATIVE
Protein, ur: NEGATIVE mg/dL
Specific Gravity, Urine: 1.02 (ref 1.005–1.030)
pH: 6 (ref 5.0–8.0)

## 2017-05-24 LAB — LIPASE, BLOOD: Lipase: 29 U/L (ref 11–51)

## 2017-05-24 LAB — I-STAT BETA HCG BLOOD, ED (MC, WL, AP ONLY): I-stat hCG, quantitative: <5 m[IU]/mL (ref <5)

## 2017-05-24 MED ORDER — KETOROLAC TROMETHAMINE 30 MG/ML IJ SOLN
30.0000 mg | Freq: Once | INTRAMUSCULAR | Status: AC
  Administered 2017-05-24: 30 mg via INTRAVENOUS
  Filled 2017-05-24: qty 1

## 2017-05-24 MED ORDER — IOPAMIDOL (ISOVUE-300) INJECTION 61%
INTRAVENOUS | Status: AC
  Administered 2017-05-24: 100 mL
  Filled 2017-05-24: qty 100

## 2017-05-24 NOTE — ED Triage Notes (Signed)
Pt states RUQ pain that radiates to R upper back, starting this morning.  Pain is like a spasm and lasts approx 7 mins.  Some nausea.  No urinary s/s per pt.  Gall bladder removed 3 years ago.

## 2017-05-24 NOTE — Discharge Instructions (Signed)
Your CT scan here was normal today and your lab values were reassuring. If you continue to have pain, please follow up with your primary care provider. Return to the ED if you experience severe worsening of your symptoms, vomiting, fevers, chills, blood in stool.

## 2017-05-24 NOTE — ED Provider Notes (Signed)
[facility]-EM[facility]ENCY DEPT Provider Note   CSN: [id number] Arrival date & time: 05/24/17  0720     History   Chief Complaint Chief Complaint  Patient presents with  . Abdominal Pain    HPI Christy Boyer is a 36 y.o. female with a past medical history of cholelithiasis status post cholecystectomy in 2015 who presents to the ED today complaining of right upper quadrant pain. Patient states that she was awakened from sleep around 6 AM this morning with sudden onset sharp right upper quadrant abdominal pain that radiated to her right flank. She states the pain has been coming and going since 6 AM this morning. She reports associated nausea but no vomiting. She denies any dysuria, vaginal bleeding, diarrhea, constipation. No history of dyspepsia or indigestion. No dysphagia. Patient states that she has not had any right upper quadrant pain since her cholecystectomy 3 years ago. She denies taking any medications. She reports seldom alcohol use. No significant NSAID use. She has never had EGD or colonoscopy. Other than gallbladder removal she has not had any other abdominal surgeries. Last menstrual cycle was on 05/20/17.  HPI  Past Medical History:  Diagnosis Date  . Gall stones     Patient Active Problem List   Diagnosis Date Noted  . Cholelithiasis with acute cholecystitis 07/08/2014  . Vitamin D deficiency 06/17/2014  . Biliary colic symptom 40/98/1191  . Abdominal pain, chronic, right upper quadrant 06/17/2014  . Gallstones 06/02/2014  . Elevated liver enzymes 06/02/2014    Past Surgical History:  Procedure Laterality Date  . CHOLECYSTECTOMY N/A 07/09/2014   Procedure: LAPAROSCOPIC CHOLECYSTECTOMY WITH INTRAOPERATIVE CHOLANGIOGRAM;  Surgeon: Almond Lint, MD;  Location: [facility];  Service: General;  Laterality: N/A;  . NO PAST SURGERIES      OB History    No data available       Home Medications    Prior to Admission medications   Not on File    Family  History Family History  Problem Relation Age of Onset  . Heart disease Father     Social History Social History  Substance Use Topics  . Smoking status: Never Smoker  . Smokeless tobacco: Never Used  . Alcohol use Yes     Comment: Very infrequently.     Allergies   Patient has no known allergies.   Review of Systems Review of Systems  All other systems reviewed and are negative.    Physical Exam Updated Vital Signs BP 113/79   Pulse 95   Temp 98.7 F (37.1 C) (Oral)   Resp 16   Ht 5' (1.524 m)   Wt 79.8 kg (176 lb)   LMP 05/20/2017   SpO2 100%   BMI 34.37 kg/m   Physical Exam  Constitutional: She is oriented to person, place, and time. She appears well-developed and well-nourished. No distress.  HENT:  Head: Normocephalic and atraumatic.  Mouth/Throat: No oropharyngeal exudate.  Eyes: Conjunctivae and EOM are normal. Pupils are equal, round, and reactive to light. Right eye exhibits no discharge. Left eye exhibits no discharge. No scleral icterus.  Cardiovascular: Normal rate, regular rhythm, normal heart sounds and intact distal pulses.  Exam reveals no gallop and no friction rub.   No murmur heard. Pulmonary/Chest: Effort normal and breath sounds normal. No respiratory distress. She has no wheezes. She has no rales. She exhibits no tenderness.  Abdominal: Soft. She exhibits no distension. There is no guarding.  Mild RUQ TTP without rebound or guarding. No peritoneal signs.  No  CVA tenderness.  Musculoskeletal: Normal range of motion. She exhibits no edema.  Neurological: She is alert and oriented to person, place, and time.  Skin: Skin is warm and dry. No rash noted. She is not diaphoretic. No erythema. No pallor.  Psychiatric: She has a normal mood and affect. Her behavior is normal.  Nursing note and vitals reviewed.    ED Treatments / Results  Labs (all labs ordered are listed, but only abnormal results are displayed) Labs Reviewed  CBC  LIPASE,  BLOOD  COMPREHENSIVE METABOLIC PANEL  URINALYSIS, ROUTINE W REFLEX MICROSCOPIC  I-STAT BETA HCG BLOOD, ED ([facility], [facility], [facility] ONLY)    EKG  EKG Interpretation None       Radiology No results found.  Procedures Procedures (including critical care time)  Medications Ordered in ED Medications - No data to display   Initial Impression / Assessment and Plan / ED Course  I have reviewed the triage vital signs and the nursing notes.  Pertinent labs & imaging results that were available during my care of the patient were reviewed by me and considered in my medical decision making (see chart for details).     Otherwise healthy 36 year old female presented to the ED today complaining of acute onset right upper quadrant abdominal pain that is sharp in nature and radiates to her right flank. Patient is status post cholecystectomy 3 years ago. On arrival to ED she is nontoxic and nonseptic appearing. Vitals are stable. Patient actually states that currently the pain has subsided. No metabolic derangement. AST mildly elevated at 51 but other LFTs are unremarkable. Total bilirubin 0.9. Alkaline phosphatase 69, arguing against obstruction or retained ductal stone. CT scan negative for acute intra-abdominal pathology. Pregnancy test negative. There is moderate hemoglobin in her urine, suspect this is from her most recent menstrual cycle she was last spotting 2 days ago. Given her current benign exam and symptom resolution in conjunction with a negative CT scan and reassuring lab work the patient is stable for discharge with PCP follow-up.  Return precautions outlined in patient discharge instructions.      Final Clinical Impressions(s) / ED Diagnoses   Final diagnoses:  Right upper quadrant abdominal pain    New Prescriptions New Prescriptions   No medications on file     Dub MikesDowless, Field Staniszewski Tripp, PA-C 05/24/17 1530    Lavera GuiseLiu, Dana Duo, MD 05/24/17 Windell [id number]

## 2019-01-24 IMAGING — CT CT ABD-PELV W/ CM
2 of 4 series · 16 of 46 positions shown, 18 images · IV contrast (APPLIED)
Comparison: None.

CLINICAL DATA: 36-year-old female with sudden onset of right lower
quadrant pain at 6 a.m. this morning. No nausea, vomiting, diarrhea
or constipation.

EXAM:
CT ABDOMEN AND PELVIS WITH CONTRAST
TECHNIQUE: Multidetector CT imaging of the abdomen and pelvis was performed
using the standard protocol following bolus administration of
intravenous contrast.
CONTRAST:  100mL [id number] IOPAMIDOL ([id number]) INJECTION 61%

[Series 3: abd/ pelvis 5.0 i30f 2 · axial · 0.77mm/px · z∈[+365,+770]mm · 13 of 89 slices shown, 15 images]
[im 4/89  soft-tissue]
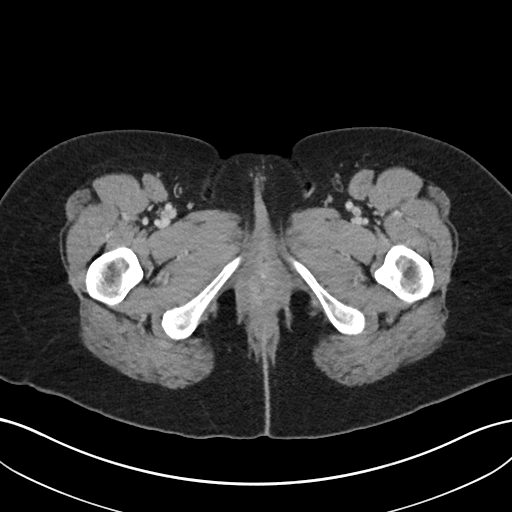
[im 4/89  bone]
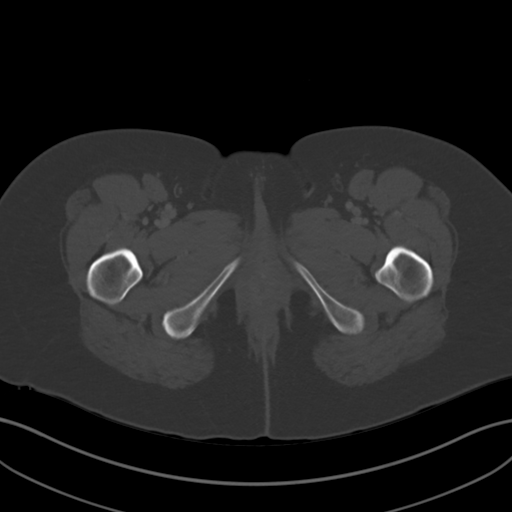
[im 12/89  soft-tissue]
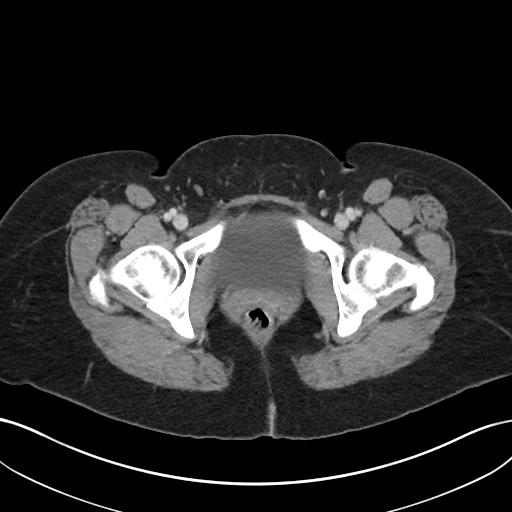
[im 19/89  soft-tissue]
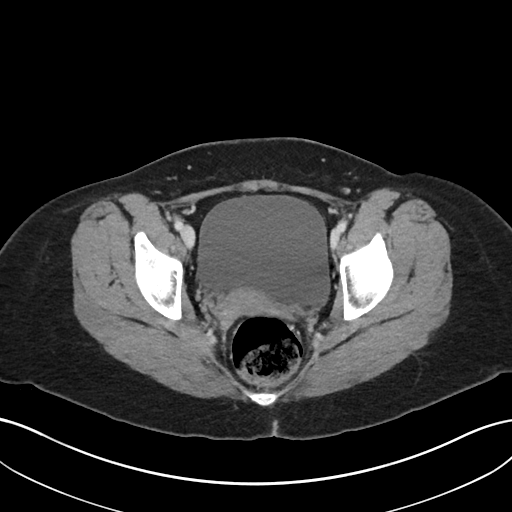
[im 26/89  soft-tissue]
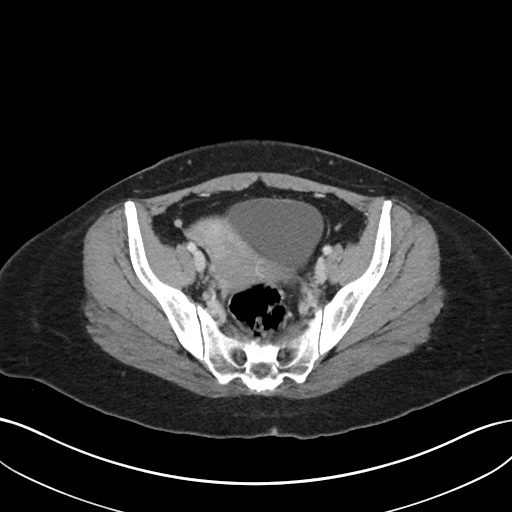
[im 30/89  soft-tissue]
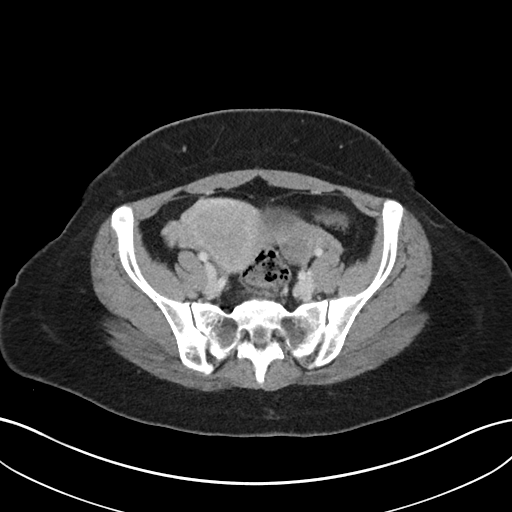
[im 37/89  soft-tissue]
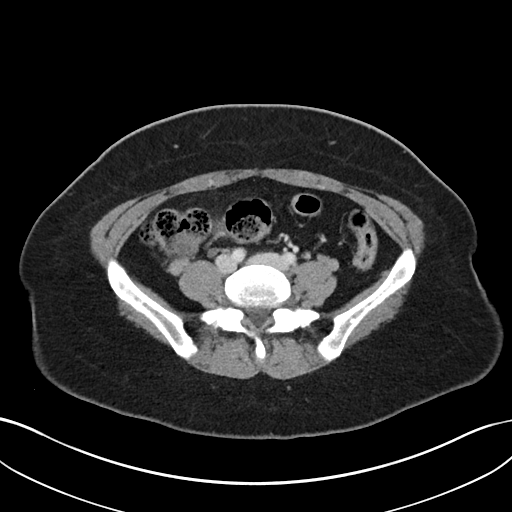
[im 45/89  soft-tissue]
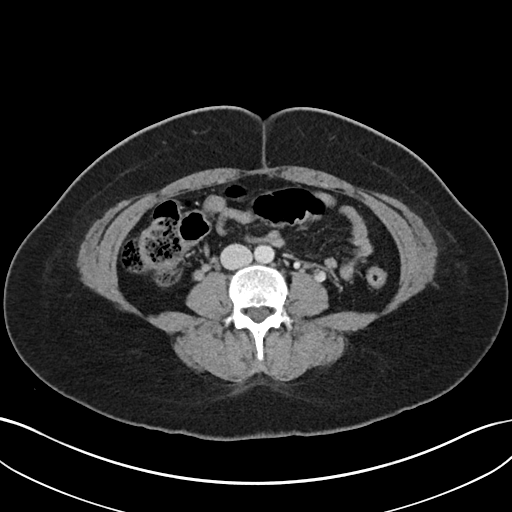
[im 52/89  soft-tissue]
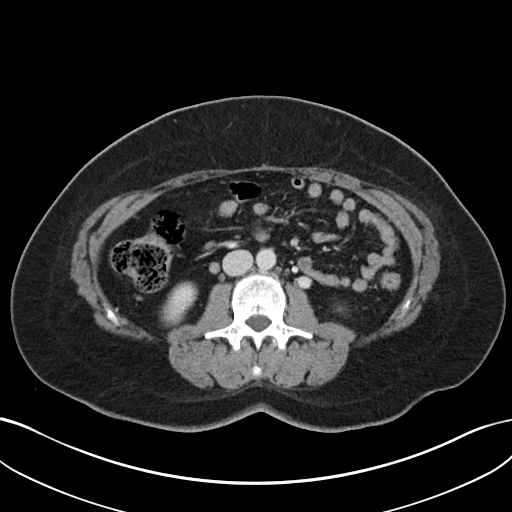
[im 59/89  soft-tissue]
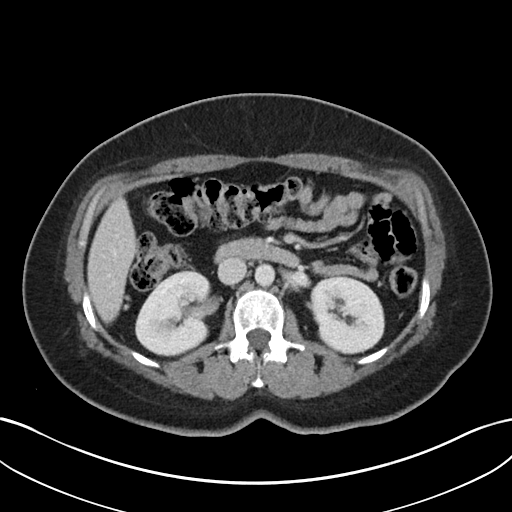
[im 59/89  bone]
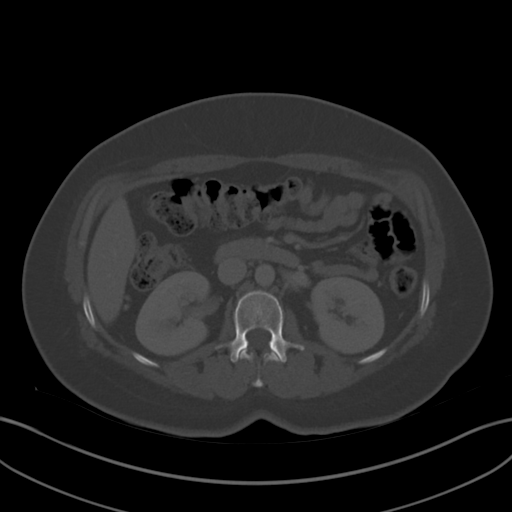
[im 63/89  soft-tissue]
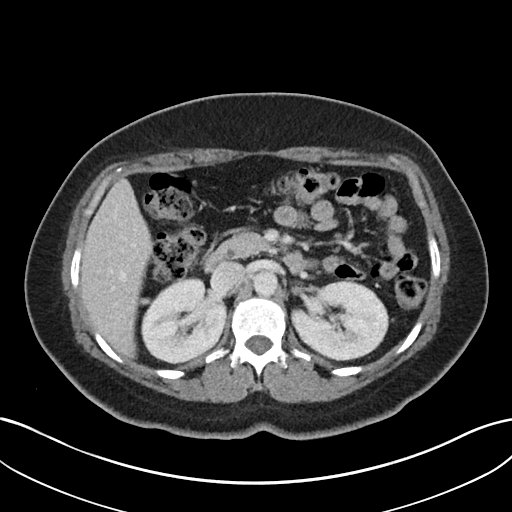
[im 70/89  soft-tissue]
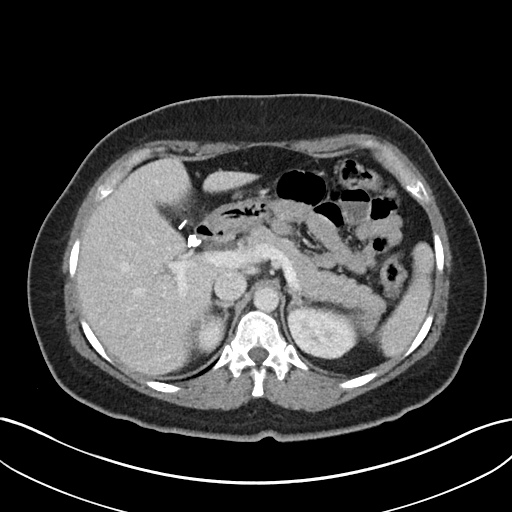
[im 78/89  soft-tissue]
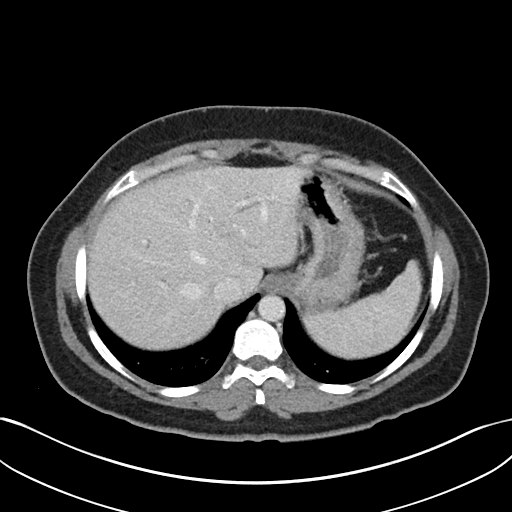
[im 85/89  soft-tissue]
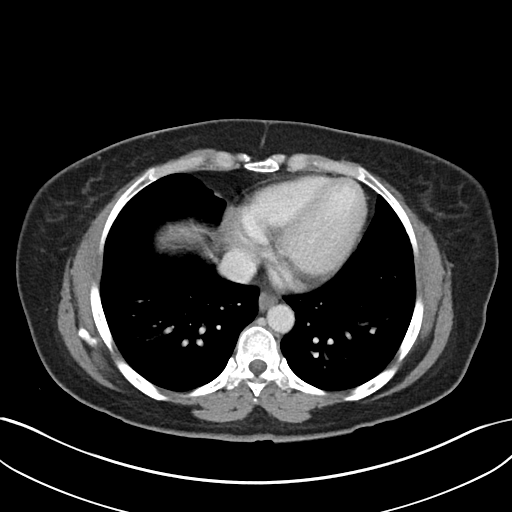

[Series 6: coronal soft tissue · coronal · 0.76mm/px · 3 of 101 slices shown]
[im 34/101  soft-tissue]
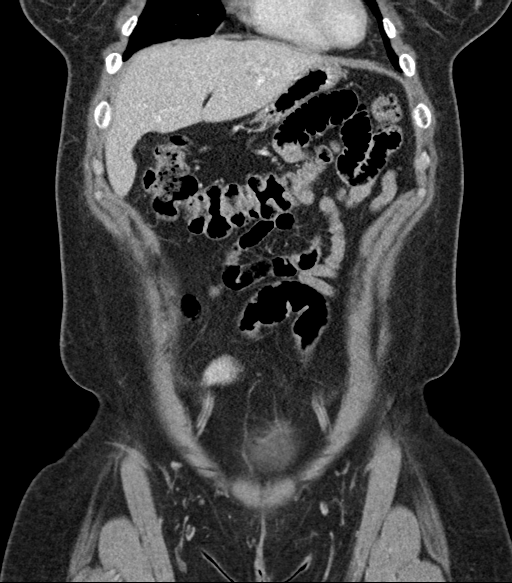
[im 45/101  soft-tissue]
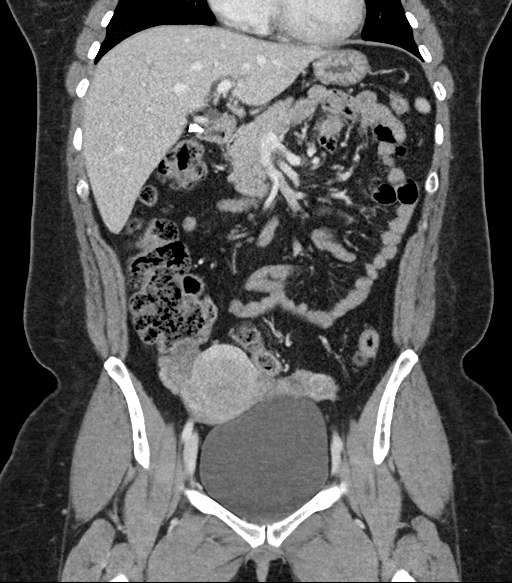
[im 56/101  soft-tissue]
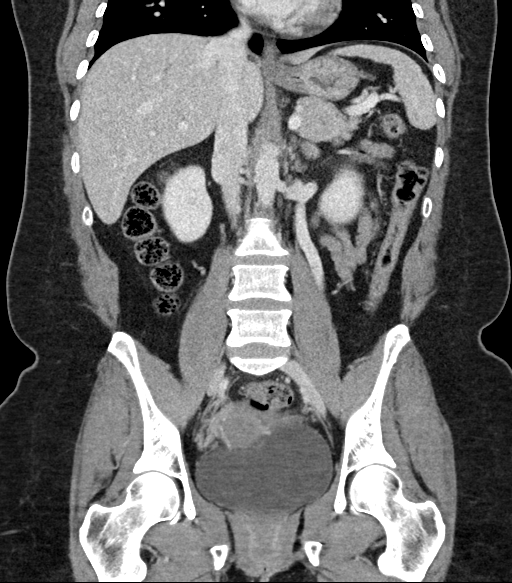

[16 of 46 positions shown; findings below may reference images not displayed]

FINDINGS: Lower chest: Unremarkable.

Hepatobiliary: No cystic or solid hepatic lesions. No intra or
extrahepatic biliary ductal dilatation. Status post cholecystectomy.

Pancreas: No pancreatic mass. No pancreatic ductal dilatation. No
pancreatic or peripancreatic fluid or inflammatory changes.

Spleen: Unremarkable.

Adrenals/Urinary Tract: There are multiple nonobstructive calculi
within the left renal collecting system, largest of which is in the
upper pole measuring 4 mm. Subcentimeter low-attenuation lesion in
the upper pole of the left kidney, too small to characterize, but
statistically likely a cyst. Right kidney and bilateral adrenal
glands are normal in appearance. No hydroureteronephrosis. Urinary
bladder is normal in appearance.

Stomach/Bowel: The appearance of the stomach is normal. There is no
pathologic dilatation of small bowel or colon. Normal appendix.

Vascular/Lymphatic: No significant atherosclerotic disease, aneurysm
or dissection identified in the abdominal or pelvic vasculature. No
lymphadenopathy noted in the abdomen or pelvis.

Reproductive: Uterus and ovaries are unremarkable in appearance.

Other: No significant volume of ascites.  No pneumoperitoneum.

Musculoskeletal: There are no aggressive appearing lytic or blastic
lesions noted in the visualized portions of the skeleton.
IMPRESSION: 1. No acute findings are noted in the abdomen or pelvis to account
for the patient's symptoms.
2. Specifically, the appendix is normal.
3. Several nonobstructive calculi are present within the left renal
collecting system measuring up to 4 mm in the upper pole. No
ureteral stones or findings of urinary tract obstruction are noted
at this time.
4. Status post cholecystectomy.

## 2020-01-06 ENCOUNTER — Ambulatory Visit: Payer: Self-pay | Attending: Internal Medicine

## 2020-01-06 DIAGNOSIS — Z20822 Contact with and (suspected) exposure to covid-19: Secondary | ICD-10-CM | POA: Insufficient documentation

## 2020-01-07 LAB — NOVEL CORONAVIRUS, NAA: SARS-CoV-2, NAA: NOT DETECTED

## 2020-02-23 ENCOUNTER — Encounter (HOSPITAL_COMMUNITY): Payer: Self-pay

## 2020-02-23 ENCOUNTER — Ambulatory Visit (HOSPITAL_COMMUNITY)
Admission: EM | Admit: 2020-02-23 | Discharge: 2020-02-23 | Disposition: A | Discharge status: Home / Self Care | Payer: No Typology Code available for payment source | Attending: Urgent Care | Admitting: Urgent Care

## 2020-02-23 ENCOUNTER — Other Ambulatory Visit: Payer: Self-pay

## 2020-02-23 DIAGNOSIS — S01112A Laceration without foreign body of left eyelid and periocular area, initial encounter: Secondary | ICD-10-CM

## 2020-02-23 DIAGNOSIS — W01198A Fall on same level from slipping, tripping and stumbling with subsequent striking against other object, initial encounter: Secondary | ICD-10-CM

## 2020-02-23 DIAGNOSIS — Z23 Encounter for immunization: Secondary | ICD-10-CM

## 2020-02-23 DIAGNOSIS — W19XXXA Unspecified fall, initial encounter: Secondary | ICD-10-CM

## 2020-02-23 DIAGNOSIS — R519 Headache, unspecified: Secondary | ICD-10-CM

## 2020-02-23 MED ORDER — ACETAMINOPHEN 325 MG PO TABS
ORAL_TABLET | ORAL | Status: AC
  Filled 2020-02-23: qty 2

## 2020-02-23 MED ORDER — TETANUS-DIPHTH-ACELL PERTUSSIS 5-2.5-18.5 LF-MCG/0.5 IM SUSP
0.5000 mL | Freq: Once | INTRAMUSCULAR | Status: AC
  Administered 2020-02-23: 14:00:00 0.5 mL via INTRAMUSCULAR

## 2020-02-23 MED ORDER — TETANUS-DIPHTH-ACELL PERTUSSIS 5-2.5-18.5 LF-MCG/0.5 IM SUSP
INTRAMUSCULAR | Status: AC
  Filled 2020-02-23: qty 0.5

## 2020-02-23 MED ORDER — ACETAMINOPHEN 325 MG PO TABS
650.0000 mg | ORAL_TABLET | Freq: Once | ORAL | Status: AC
  Administered 2020-02-23: 650 mg via ORAL

## 2020-02-23 NOTE — ED Triage Notes (Signed)
Pt states he was walking alone and fell down somehow she has a cut over her left eyebrow. This happened abut a hour ago.

## 2020-02-23 NOTE — Discharge Instructions (Addendum)
Tome 500mg  de Tylenol con ibuprofen 400-600mg  cada 6 horas con comida para dolor y inflammacion.  CUIDADO DE HERIDAS Regrese en 7-10 das para que le quiten los puntos / grapas o antes si tiene alguna inquietud.  Mantenga el rea limpia y seca durante 24 horas. No retire el vendaje, si se aplica.  Despus de 24 horas, retire el vendaje y lave la herida suavemente con un jabn suave y agua tibia. Vuelva a aplicar un nuevo vendaje despus de limpiar la herida, si se le indica.  Contine la limpieza diaria con agua y jabn hasta que le quiten los puntos / grapas.  No aplique ningn ungento o cremas en la herida mientras los puntos / grapas estn en su lugar, ya que esto puede causar un retraso en la cicatrizacin.  Notifique al consultorio si experimenta alguno de los siguientes signos de infeccin: hinchazn, enrojecimiento, secrecin de pus, vetas, fiebre> 101.0 F  Notifique al consultorio si experimenta un sangrado excesivo que no se detiene despus de 15 a 20 minutos de presin firme y .

## 2020-02-23 NOTE — ED Provider Notes (Addendum)
[facility]   MRN: [id number] DOB: May 29, 1981  Subjective:   Christy Boyer is a 39 y.o. female presenting for suffering an accidental fall.  Patient states that she lost her footing and was tripping toward a door, ended up colliding face first into the door.  Was wearing her eyeglasses and cut the left portion of her eyebrow.  She denies loss of consciousness, headache, confusion, vision change.  Has not taken anything for pain.  She came straight to our clinic.  Last Tdap was more than 10 years ago.  No current facility-administered medications for this encounter.  Current Outpatient Medications:  .  ibuprofen (ADVIL,MOTRIN) 200 MG tablet, Take 200 mg by mouth every 6 (six) hours as needed for moderate pain., Disp: , Rfl:    No Known Allergies  Past Medical History:  Diagnosis Date  . Gall stones      Past Surgical History:  Procedure Laterality Date  . CHOLECYSTECTOMY N/A 07/09/2014   Procedure: LAPAROSCOPIC CHOLECYSTECTOMY WITH INTRAOPERATIVE CHOLANGIOGRAM;  Surgeon: Stark Klein, MD;  Location: [facility];  Service: General;  Laterality: N/A;  . NO PAST SURGERIES      Family History  Problem Relation Age of Onset  . Heart disease Father     Social History   Tobacco Use  . Smoking status: Never Smoker  . Smokeless tobacco: Never Used  Substance Use Topics  . Alcohol use: Yes    Comment: Very infrequently.  . Drug use: No    ROS   Objective:   Vitals: BP 135/64 (BP Location: Right Arm)   Pulse 86   Temp 99 F (37.2 C) (Oral)   Resp 16   Wt 180 lb (81.6 kg)   LMP 02/06/2020   BMI 35.15 kg/m   Physical Exam Constitutional:      General: She is not in acute distress.    Appearance: Normal appearance. She is well-developed. She is not ill-appearing, toxic-appearing or diaphoretic.  HENT:     Head: Normocephalic and atraumatic.      Nose: Nose normal.     Mouth/Throat:     Mouth: Mucous membranes are moist.     Pharynx: Oropharynx is  clear.  Eyes:     General: No scleral icterus.    Extraocular Movements: Extraocular movements intact.     Pupils: Pupils are equal, round, and reactive to light.  Cardiovascular:     Rate and Rhythm: Normal rate.  Pulmonary:     Effort: Pulmonary effort is normal.  Skin:    General: Skin is warm and dry.  Neurological:     General: No focal deficit present.     Mental Status: She is alert and oriented to person, place, and time.     Cranial Nerves: No cranial nerve deficit.     Motor: No weakness.     Coordination: Coordination normal.     Gait: Gait normal.     Deep Tendon Reflexes: Reflexes normal.  Psychiatric:        Mood and Affect: Mood normal.        Behavior: Behavior normal.        Thought Content: Thought content normal.        Judgment: Judgment normal.     PROCEDURE NOTE: laceration repair Verbal consent obtained from patient.  Local anesthesia with 4cc Lidocaine 2% with epinephrine.  Wound explored for tendon, ligament damage. Wound scrubbed with soap and water and rinsed. Wound closed with #5 6-0 Ethilon (simple interrupted) sutures.  Wound  cleansed and dressed.   Assessment and Plan :   1. Eyebrow laceration, left, initial encounter   2. Facial pain   3. Fall, initial encounter     Laceration repair successful.  Wound care reviewed.  Use Tylenol and ibuprofen for pain relief.  No evidence of head/brain injury.  Return to clinic in 5 to 6 days for suture removal. Counseled patient on potential for adverse effects with medications prescribed/recommended today, ER and return-to-clinic precautions discussed, patient verbalized understanding.    Wallis Bamberg, PA-C 02/23/20 1502  Tdap given   Wallis Bamberg, PA-C 03/22/20 1009

## 2020-09-08 ENCOUNTER — Other Ambulatory Visit: Payer: No Typology Code available for payment source

## 2020-09-08 ENCOUNTER — Other Ambulatory Visit: Payer: Self-pay

## 2020-09-08 DIAGNOSIS — Z20822 Contact with and (suspected) exposure to covid-19: Secondary | ICD-10-CM

## 2020-09-09 LAB — NOVEL CORONAVIRUS, NAA: SARS-CoV-2, NAA: NOT DETECTED

## 2020-09-09 LAB — SARS-COV-2, NAA 2 DAY TAT

## 2023-11-22 ENCOUNTER — Encounter (HOSPITAL_COMMUNITY): Payer: Self-pay

## 2023-11-23 ENCOUNTER — Encounter (HOSPITAL_COMMUNITY): Payer: Self-pay
